# Patient Record
Sex: Female | Born: 1970 | Race: White | Hispanic: No | Marital: Single | Smoking: Former smoker
Health system: Southern US, Community
[De-identification: ages and names within clinical notes are randomized; demographics above are authoritative.]

## PROBLEM LIST (undated history)

## (undated) DIAGNOSIS — R002 Palpitations: Secondary | ICD-10-CM

## (undated) DIAGNOSIS — IMO0002 Reserved for concepts with insufficient information to code with codable children: Secondary | ICD-10-CM

## (undated) DIAGNOSIS — D649 Anemia, unspecified: Secondary | ICD-10-CM

## (undated) DIAGNOSIS — D721 Eosinophilia, unspecified: Secondary | ICD-10-CM

## (undated) HISTORY — DX: Eosinophilia: D72.1

## (undated) HISTORY — DX: Reserved for concepts with insufficient information to code with codable children: IMO0002

## (undated) HISTORY — DX: Anemia, unspecified: D64.9

## (undated) HISTORY — DX: Palpitations: R00.2

## (undated) HISTORY — DX: Eosinophilia, unspecified: D72.10

## (undated) HISTORY — PX: OTHER SURGICAL HISTORY: SHX169

---

## 1992-01-21 HISTORY — PX: WISDOM TOOTH EXTRACTION: SHX21

## 1997-01-20 HISTORY — PX: FLEXIBLE SIGMOIDOSCOPY: SHX1649

## 1997-05-03 ENCOUNTER — Other Ambulatory Visit: Admission: RE | Admit: 1997-05-03 | Discharge: 1997-05-03 | Payer: Self-pay | Admitting: Gynecology

## 1997-06-16 ENCOUNTER — Ambulatory Visit (HOSPITAL_COMMUNITY): Admission: RE | Admit: 1997-06-16 | Discharge: 1997-06-16 | Payer: Self-pay | Admitting: Obstetrics and Gynecology

## 1997-10-04 ENCOUNTER — Encounter: Admission: RE | Admit: 1997-10-04 | Discharge: 1998-01-02 | Payer: Self-pay | Admitting: Gynecology

## 1997-12-15 ENCOUNTER — Inpatient Hospital Stay (HOSPITAL_COMMUNITY): Admission: AD | Admit: 1997-12-15 | Discharge: 1997-12-17 | Payer: Self-pay | Admitting: Obstetrics and Gynecology

## 1997-12-25 ENCOUNTER — Encounter (HOSPITAL_COMMUNITY): Admission: RE | Admit: 1997-12-25 | Discharge: 1998-03-25 | Payer: Self-pay | Admitting: Obstetrics and Gynecology

## 1998-01-26 ENCOUNTER — Other Ambulatory Visit: Admission: RE | Admit: 1998-01-26 | Discharge: 1998-01-26 | Payer: Self-pay | Admitting: Obstetrics and Gynecology

## 1999-04-02 ENCOUNTER — Other Ambulatory Visit: Admission: RE | Admit: 1999-04-02 | Discharge: 1999-04-02 | Payer: Self-pay | Admitting: Obstetrics and Gynecology

## 2000-01-21 DIAGNOSIS — R002 Palpitations: Secondary | ICD-10-CM

## 2000-01-21 HISTORY — DX: Palpitations: R00.2

## 2000-04-03 ENCOUNTER — Other Ambulatory Visit: Admission: RE | Admit: 2000-04-03 | Discharge: 2000-04-03 | Payer: Self-pay | Admitting: Obstetrics and Gynecology

## 2001-04-06 ENCOUNTER — Other Ambulatory Visit: Admission: RE | Admit: 2001-04-06 | Discharge: 2001-04-06 | Payer: Self-pay | Admitting: Obstetrics and Gynecology

## 2001-10-07 ENCOUNTER — Ambulatory Visit (HOSPITAL_COMMUNITY): Admission: RE | Admit: 2001-10-07 | Discharge: 2001-10-07 | Payer: Self-pay | Admitting: Obstetrics and Gynecology

## 2001-10-07 ENCOUNTER — Encounter (INDEPENDENT_AMBULATORY_CARE_PROVIDER_SITE_OTHER): Payer: Self-pay | Admitting: Specialist

## 2002-02-03 ENCOUNTER — Other Ambulatory Visit: Admission: RE | Admit: 2002-02-03 | Discharge: 2002-02-03 | Payer: Self-pay | Admitting: Obstetrics and Gynecology

## 2002-03-17 ENCOUNTER — Encounter: Admission: RE | Admit: 2002-03-17 | Discharge: 2002-06-15 | Payer: Self-pay | Admitting: Obstetrics and Gynecology

## 2002-09-05 ENCOUNTER — Inpatient Hospital Stay (HOSPITAL_COMMUNITY): Admission: AD | Admit: 2002-09-05 | Discharge: 2002-09-07 | Payer: Self-pay | Admitting: Obstetrics and Gynecology

## 2002-10-17 ENCOUNTER — Other Ambulatory Visit: Admission: RE | Admit: 2002-10-17 | Discharge: 2002-10-17 | Payer: Self-pay | Admitting: Obstetrics and Gynecology

## 2003-10-23 ENCOUNTER — Other Ambulatory Visit: Admission: RE | Admit: 2003-10-23 | Discharge: 2003-10-23 | Payer: Self-pay | Admitting: Obstetrics and Gynecology

## 2004-06-19 ENCOUNTER — Ambulatory Visit: Payer: Self-pay | Admitting: Internal Medicine

## 2004-07-25 ENCOUNTER — Ambulatory Visit: Payer: Self-pay | Admitting: Internal Medicine

## 2004-08-01 ENCOUNTER — Ambulatory Visit: Payer: Self-pay | Admitting: Internal Medicine

## 2004-11-05 ENCOUNTER — Other Ambulatory Visit: Admission: RE | Admit: 2004-11-05 | Discharge: 2004-11-05 | Payer: Self-pay | Admitting: Obstetrics and Gynecology

## 2005-11-17 ENCOUNTER — Other Ambulatory Visit: Admission: RE | Admit: 2005-11-17 | Discharge: 2005-11-17 | Payer: Self-pay | Admitting: Obstetrics and Gynecology

## 2006-02-17 ENCOUNTER — Ambulatory Visit: Payer: Self-pay | Admitting: Internal Medicine

## 2006-04-29 ENCOUNTER — Ambulatory Visit: Payer: Self-pay | Admitting: Internal Medicine

## 2006-04-29 LAB — CONVERTED CEMR LAB
Basophils Relative: 1.5 % — ABNORMAL HIGH (ref 0.0–1.0)
Eosinophils Relative: 3.9 % (ref 0.0–5.0)
Free T4: 0.7 ng/dL (ref 0.6–1.6)
HCT: 37.4 % (ref 36.0–46.0)
Hemoglobin: 12.7 g/dL (ref 12.0–15.0)
Magnesium: 2.1 mg/dL (ref 1.5–2.5)
Monocytes Absolute: 0.3 10*3/uL (ref 0.2–0.7)
Neutrophils Relative %: 68.4 % (ref 43.0–77.0)
RBC: 4.21 M/uL (ref 3.87–5.11)
RDW: 13.9 % (ref 11.5–14.6)
T3, Free: 2.5 pg/mL (ref 2.3–4.2)
TSH: 1.91 microintl units/mL (ref 0.35–5.50)
WBC: 6.5 10*3/uL (ref 4.5–10.5)

## 2006-05-15 ENCOUNTER — Ambulatory Visit: Payer: Self-pay

## 2006-11-23 ENCOUNTER — Other Ambulatory Visit: Admission: RE | Admit: 2006-11-23 | Discharge: 2006-11-23 | Payer: Self-pay | Admitting: Obstetrics and Gynecology

## 2007-01-20 ENCOUNTER — Telehealth: Payer: Self-pay | Admitting: Internal Medicine

## 2007-03-09 ENCOUNTER — Telehealth (INDEPENDENT_AMBULATORY_CARE_PROVIDER_SITE_OTHER): Payer: Self-pay | Admitting: *Deleted

## 2007-03-15 ENCOUNTER — Ambulatory Visit: Payer: Self-pay | Admitting: Internal Medicine

## 2007-03-15 DIAGNOSIS — J4520 Mild intermittent asthma, uncomplicated: Secondary | ICD-10-CM | POA: Insufficient documentation

## 2008-05-08 ENCOUNTER — Telehealth (INDEPENDENT_AMBULATORY_CARE_PROVIDER_SITE_OTHER): Payer: Self-pay | Admitting: *Deleted

## 2008-05-10 ENCOUNTER — Telehealth (INDEPENDENT_AMBULATORY_CARE_PROVIDER_SITE_OTHER): Payer: Self-pay | Admitting: *Deleted

## 2008-05-23 ENCOUNTER — Ambulatory Visit: Payer: Self-pay | Admitting: Internal Medicine

## 2008-05-23 DIAGNOSIS — D649 Anemia, unspecified: Secondary | ICD-10-CM | POA: Insufficient documentation

## 2008-05-23 LAB — CONVERTED CEMR LAB
Basophils Absolute: 0 10*3/uL (ref 0.0–0.1)
Direct LDL: 70.3 mg/dL
HCT: 36.7 % (ref 36.0–46.0)
Hemoglobin: 12.4 g/dL (ref 12.0–15.0)
Lymphs Abs: 1.7 10*3/uL (ref 0.7–4.0)
MCV: 85.5 fL (ref 78.0–100.0)
Monocytes Absolute: 0.4 10*3/uL (ref 0.1–1.0)
Monocytes Relative: 7.3 % (ref 3.0–12.0)
Neutro Abs: 2.1 10*3/uL (ref 1.4–7.7)
RDW: 15.1 % — ABNORMAL HIGH (ref 11.5–14.6)
Saturation Ratios: 15 % — ABNORMAL LOW (ref 20.0–50.0)
Total CHOL/HDL Ratio: 2
VLDL: 30.8 mg/dL (ref 0.0–40.0)
Vitamin B-12: 291 pg/mL (ref 211–911)

## 2008-05-24 ENCOUNTER — Encounter (INDEPENDENT_AMBULATORY_CARE_PROVIDER_SITE_OTHER): Payer: Self-pay | Admitting: *Deleted

## 2008-06-02 ENCOUNTER — Ambulatory Visit: Payer: Self-pay | Admitting: Internal Medicine

## 2008-06-02 LAB — CONVERTED CEMR LAB
OCCULT 1: NEGATIVE
OCCULT 2: NEGATIVE
OCCULT 3: NEGATIVE

## 2008-06-05 ENCOUNTER — Encounter (INDEPENDENT_AMBULATORY_CARE_PROVIDER_SITE_OTHER): Payer: Self-pay | Admitting: *Deleted

## 2008-08-25 ENCOUNTER — Encounter: Payer: Self-pay | Admitting: Internal Medicine

## 2008-09-13 ENCOUNTER — Encounter (INDEPENDENT_AMBULATORY_CARE_PROVIDER_SITE_OTHER): Payer: Self-pay | Admitting: *Deleted

## 2008-09-18 ENCOUNTER — Ambulatory Visit: Payer: Self-pay | Admitting: Internal Medicine

## 2008-09-20 ENCOUNTER — Ambulatory Visit: Payer: Self-pay | Admitting: Internal Medicine

## 2008-11-16 ENCOUNTER — Emergency Department (HOSPITAL_COMMUNITY): Admission: EM | Admit: 2008-11-16 | Discharge: 2008-11-16 | Payer: Self-pay | Admitting: Emergency Medicine

## 2009-07-30 ENCOUNTER — Telehealth (INDEPENDENT_AMBULATORY_CARE_PROVIDER_SITE_OTHER): Payer: Self-pay | Admitting: *Deleted

## 2009-12-17 ENCOUNTER — Telehealth (INDEPENDENT_AMBULATORY_CARE_PROVIDER_SITE_OTHER): Payer: Self-pay | Admitting: *Deleted

## 2010-02-19 NOTE — Progress Notes (Signed)
Summary: Refill Request  Phone Note Refill Request Call back at [phone] Message from:  Pharmacy on July 30, 2009 8:26 AM  Refills Requested: Medication #1:  SYMBICORT 80-4.5 MCG/ACT AERO take 1-2 inhalation every 12 hours   Dosage confirmed as above?Dosage Confirmed   Supply Requested: 1 month   Last Refilled: 04/28/2009 Karin Golden  [phone] [place] [address]  Next Appointment Scheduled: none Initial call taken by: Lavell Islam,  July 30, 2009 8:27 AM    Prescriptions: SYMBICORT 80-4.5 MCG/ACT AERO (BUDESONIDE-FORMOTEROL FUMARATE) take 1-2 inhalation every 12 hours  #1 x 0   Entered by:   Shonna Chock   Authorized by:   Marga Melnick MD   Signed by:   Shonna Chock on 07/30/2009   Method used:   Electronically to        [facility] (retail)       [address]       [place], [place]  [address]       Ph: [phone]       Fax: [phone]   RxID:   [id number]  Due for yearly (CPX-30 min appointment)./ Shonna Chock  July 30, 2009 8:43 AM

## 2010-02-19 NOTE — Progress Notes (Signed)
Summary: Refill Request  Phone Note Refill Request Call back at [phone] Message from:  Pharmacy on December 17, 2009 10:13 AM  Refills Requested: Medication #1:  SYMBICORT 80-4.5 MCG/ACT AERO take 1-2 inhalation every 12 hours   Dosage confirmed as above?Dosage Confirmed   Supply Requested: 10.2   Last Refilled: 07/30/2009 Karin Golden on [address]  Next Appointment Scheduled: none Initial call taken by: Harold Barban,  December 17, 2009 10:13 AM    Prescriptions: SYMBICORT 80-4.5 MCG/ACT AERO (BUDESONIDE-FORMOTEROL FUMARATE) take 1-2 inhalation every 12 hours  #1 x 0   Entered by:   Shonna Chock CMA   Authorized by:   Marga Melnick MD   Signed by:   Shonna Chock CMA on 12/17/2009   Method used:   Electronically to        [facility] (retail)       [address]       [place], [place]  [address]       Ph: [phone]       Fax: [phone]   RxID:   [id number]

## 2010-03-26 ENCOUNTER — Ambulatory Visit: Payer: Self-pay | Admitting: Internal Medicine

## 2010-05-17 ENCOUNTER — Encounter: Payer: Self-pay | Admitting: Internal Medicine

## 2010-06-07 NOTE — Assessment & Plan Note (Signed)
[facility]                        [place] OFFICE NOTE   Jennifer Stephens                        MRN:          [id number]  DATE:04/29/2006                            DOB:          Feb 21, 1970    Jennifer Stephens was seen April 29, 2006 complaining of palpitations and  irregular heartbeat. She had one episode in July while playing softball  and the second while playing golf in October 2007. The most recent  occurred the day of office visit while playing tennis. She believes she  may have had milder episodes since 2006 which lasted less than 1 minute.  She describes her heart as racing double time; this morning it lasted  15 minutes.   She does describe increased stress; she has 2 young  children.   She denies headache, flushing, chest pain or diarrhea.   She has been told she has borderline anemia.   PAST MEDICAL HISTORY:  Oral surgery, 2 pregnancies and deliveries. The  palpitations were evaluated in 2002 with a Holter which revealed sinus  arrhythmia. She had a flexible sigmoidoscopy for rectal bleeding in 1999  which was negative. She does have a history of asthma.   FAMILY HISTORY:  Prostate cancer, heart attack, emphysema, stroke, four-  vessel bypass in her father.   She quit smoking in 1998. She will have 4-5 beers a night and questions  the role of the alcohol in relation to the palpitations. She also drinks  3 cups of coffee a day.   She denies ingestion of stimulants. She has no known drug allergies. She  is on Asmanex and Lexapro as well as p.r.n. fexofenadine.   VITAL SIGNS:  Weight is stable at 147.6, pulse was 58 and regular,  respiratory rate 12 and blood pressure 104/62.  NECK:  The thyroid was full but doughy.  HEART:  She had an S4 with no murmurs or gallops. The aorta is palpable.  There is no aortic aneurysm.  EXTREMITIES:  All pulses are intact, there is no edema. Skin is warm and  dry. Deep tendon reflexes are  normal.  NEUROPSYCHIATRIC:  Normal.   Normal negative studies include CBC and differential, potassium,  calcium, magnesium,full thyroid profile & EKG .   Because these episodes have been associated with exercise, a Bruce  stress test will be scheduled. If that is negative and symptoms persist,  a cardiology consultation or an event monitor would be pursued.     Titus Dubin. Alwyn Ren, MD,FACP,FCCP  Electronically Signed    [facility]/MedQ  DD: 05/01/2006  DT: 05/01/2006  Job #: [id number]

## 2010-06-07 NOTE — H&P (Signed)
   NAMEKADEN, DUNKEL                             ACCOUNT NO.:  [id number]   MEDICAL RECORD NO.:  [id number]                   PATIENT TYPE:  AMB   LOCATION:  SDC                                  [facility]ILITY:  [facility]   PHYSICIAN:  James A. Ashley Royalty, M.D.             DATE OF BIRTH:  [birth year]   DATE OF ADMISSION:  10/07/2001  DATE OF DISCHARGE:                                HISTORY & PHYSICAL   HISTORY OF PRESENT ILLNESS:  The patient is a 40 year old gravida 2, para 1  at approximately 10 weeks one day gestation by last menstrual period.  She  presented today for new OB appointment.  Fetal heart tones could not be  auscultated with the Doppler.  She was subsequently taken to ultrasound at  which time a missed abortion was diagnosed.  The fetus was noted to be  approximately 6 weeks 6 days size with no cardiac activity.  The uterus was  noted to be retroverted.   PAST MEDICAL HISTORY:  Asthma.   PAST SURGICAL HISTORY:  None.   ALLERGIES:  None.   SOCIAL HISTORY:  The patient denies use of tobacco or significant alcohol.   MEDICATIONS:  Albuterol, Serevent, Flovent.   PHYSICAL EXAMINATION:  GENERAL:  Well-developed, well-nourished, pleasant  white female in no acute distress.  VITAL SIGNS:  Vital signs stable.  SKIN:  Warm and dry without lesions.  NODES:  There is no supraclavicular, cervical, or inguinal adenopathy.  HEENT:  Normocephalic.  NECK:  Supple without thyromegaly.  CHEST:  Lungs are clear.  CARDIAC:  Regular rate and rhythm without murmur, rub, or gallop.  BREASTS:  No palpable mass, discharge, drainage, or adenopathy.  ABDOMEN:  Soft, nontender without masses or organomegaly.  Bowel sounds are  active.  MUSCULOSKELETAL:  Full range of motion without edema, cyanosis, or CVA  tenderness.  PELVIC:  External genitalia within normal limits.  Vagina and cervix are  without gross lesions.  Bimanual examination reveals the uterus to be  approximately 8 x 4 x 4 cm.  No  adnexal masses were palpable.   IMPRESSION:  Missed abortion at approximately [redacted] weeks gestation.   PLAN:  Suction dilatation and curettage.  Risks, benefits, complications,  and alternatives fully discussed with the patient.  She states she  understands and accepts.  Questions invited and answered.                                               James A. Ashley Royalty, M.D.   JAM/MEDQ  D:  10/07/2001  T:  10/07/2001  Job:  [id number]

## 2010-06-07 NOTE — Op Note (Signed)
   NAMEJOLYNNE, Jennifer Stephens                             ACCOUNT NO.:  [id number]   MEDICAL RECORD NO.:  [id number]                   PATIENT TYPE:  AMB   LOCATION:  SDC                                  [facility]   PHYSICIAN:  James A. Ashley Royalty, M.D.             DATE OF BIRTH:  1970/06/04   DATE OF PROCEDURE:  10/07/2001  DATE OF DISCHARGE:  10/07/2001                                 OPERATIVE REPORT   PREOPERATIVE DIAGNOSES:  Missed abortion at [redacted] weeks gestation.   POSTOPERATIVE DIAGNOSES:  Missed abortion at [redacted] weeks gestation.   PROCEDURE:  Suction dilatation and curettage.   SURGEON:  Rudy Jew. Ashley Royalty, M.D.   ANESTHESIA:  Monitored anesthesia care with 1% Xylocaine paracervical block  (20 cc).   FINDINGS:  The uterus was sounded to approximately 10 cc.  A moderate amount  of apparent products of conception was recovered.   ESTIMATED BLOOD LOSS:  75 cc.   COMPLICATIONS:  None.   PACKS AND DRAINS:  None.   PROCEDURE:  The patient was taken to the operating room and placed in the  dorsal supine position.  After adequate IV sedation was administered, she  was placed in the lithotomy position and was prepped and draped in the usual  manner for vaginal surgery.  A posterior weighted retractor was placed per  vagina.  The anterior lip of the cervix was grasped with a single-tooth  tenaculum.  Next, 1% Xylocaine was instilled circumferentially around the  cervix with a quantity of 20 cc total.  The uterus was gently sounded to  approximately 10 cm.  The cervix was then serially dilated to a size 31  [place] using [employer] dilators.  A 10 mm suction curette was then introduced  into the uterine cavity.  Suction was applied.  A moderate amount of  apparent products of conception was delivered through the tubing.  After  several passes with the suction curette, no additional tissue was obtained.  At this point, the vaginal instruments were removed.  Hemostasis was noted  and the  procedure terminated.   The patient tolerated the procedure extremely well and was returned to the  recovery room in good condition.  Type and Rh pending.  If Rh-, she will be  given RhoGAM.                                               Rudy Jew. Ashley Royalty, M.D.    JAM/MEDQ  D:  10/07/2001  T:  10/09/2001  Job:  [id number]

## 2010-06-07 NOTE — Assessment & Plan Note (Signed)
[facility]                                 ON-CALL NOTE   RHEDA, KASSAB                          MRN:          [id number]  DATE:09/12/2006                            DOB:          [birth year]    PHONE NUMBER:  [phone]   PRIMARY CARE PHYSICIAN:  Marga Melnick, MD   TIME OF CALL:  10:25 p.m. on August 23   She calls me because she in [place] and just ran out of her  Symbicort and wants a refill.   PLAN:  I explained that at 10:30 at night on a Saturday is not the  appropriate time to be calling for refills and that we try not to refill  medicines on the weekend. She promptly started crying and telling me she  was not abusing the system and she is out of town, Catering manager, etc.. I thought  I was being very realistic and just trying to educate her about being  aware of her prescriptions and she said it was totally not her fault,  how could she know that she was running out of her inhaler and I  suggested that I certainly would not know when she was running out of  her inhaler and asked her to keep an eye on her dates. Anyway, I did try  to educate her about that and I do not think it went too well and I did  refill her Symbicort once with no refills to pharmacy in [place]  at [phone].     Karie Schwalbe, MD  Electronically Signed    RIL/MedQ  DD: 09/12/2006  DT: 09/13/2006  Job #: [id number]   cc:   Titus Dubin. Alwyn Ren, MD,FACP,FCCP

## 2010-07-13 ENCOUNTER — Encounter: Payer: Self-pay | Admitting: Family Medicine

## 2010-07-18 ENCOUNTER — Ambulatory Visit (INDEPENDENT_AMBULATORY_CARE_PROVIDER_SITE_OTHER): Payer: BC Managed Care – PPO | Admitting: Internal Medicine

## 2010-07-18 ENCOUNTER — Encounter: Payer: Self-pay | Admitting: Internal Medicine

## 2010-07-18 VITALS — BP 120/78 | HR 56 | Temp 97.9°F | Resp 16 | Ht 67.5 in | Wt 154.0 lb

## 2010-07-18 DIAGNOSIS — Z789 Other specified health status: Secondary | ICD-10-CM

## 2010-07-18 DIAGNOSIS — D649 Anemia, unspecified: Secondary | ICD-10-CM

## 2010-07-18 DIAGNOSIS — J45909 Unspecified asthma, uncomplicated: Secondary | ICD-10-CM

## 2010-07-18 DIAGNOSIS — Z52008 Unspecified donor, other blood: Secondary | ICD-10-CM | POA: Insufficient documentation

## 2010-07-18 DIAGNOSIS — Z136 Encounter for screening for cardiovascular disorders: Secondary | ICD-10-CM

## 2010-07-18 DIAGNOSIS — Z0000 Encounter for general adult medical examination without abnormal findings: Secondary | ICD-10-CM

## 2010-07-18 LAB — CBC WITH DIFFERENTIAL/PLATELET
Basophils Relative: 1.8 % (ref 0.0–3.0)
Lymphocytes Relative: 29.1 % (ref 12.0–46.0)
Lymphs Abs: 1.5 10*3/uL (ref 0.7–4.0)
MCHC: 33.7 g/dL (ref 30.0–36.0)
Monocytes Absolute: 0.4 10*3/uL (ref 0.1–1.0)
Neutro Abs: 2.5 10*3/uL (ref 1.4–7.7)
Neutrophils Relative %: 48.2 % (ref 43.0–77.0)
RDW: 17 % — ABNORMAL HIGH (ref 11.5–14.6)

## 2010-07-18 LAB — HEPATIC FUNCTION PANEL
Alkaline Phosphatase: 33 U/L — ABNORMAL LOW (ref 39–117)
Bilirubin, Direct: 0.1 mg/dL (ref 0.0–0.3)
Total Protein: 7.1 g/dL (ref 6.0–8.3)

## 2010-07-18 LAB — IBC PANEL
Iron: 80 ug/dL (ref 42–145)
Saturation Ratios: 14.6 % — ABNORMAL LOW (ref 20.0–50.0)
Transferrin: 390.7 mg/dL — ABNORMAL HIGH (ref 212.0–360.0)

## 2010-07-18 LAB — BASIC METABOLIC PANEL
BUN: 10 mg/dL (ref 6–23)
CO2: 25 mEq/L (ref 19–32)
Calcium: 9.3 mg/dL (ref 8.4–10.5)
Creatinine, Ser: 0.7 mg/dL (ref 0.4–1.2)
Glucose, Bld: 78 mg/dL (ref 70–99)

## 2010-07-18 LAB — TSH: TSH: 1.62 u[IU]/mL (ref 0.35–5.50)

## 2010-07-18 LAB — LIPID PANEL
Cholesterol: 224 mg/dL — ABNORMAL HIGH (ref 0–200)
Total CHOL/HDL Ratio: 2
VLDL: 22.2 mg/dL (ref 0.0–40.0)

## 2010-07-18 MED ORDER — ALBUTEROL SULFATE HFA 108 (90 BASE) MCG/ACT IN AERS: 2.0000 | INHALATION_SPRAY | RESPIRATORY_TRACT | Status: DC | PRN

## 2010-07-18 MED ORDER — BUDESONIDE-FORMOTEROL FUMARATE 80-4.5 MCG/ACT IN AERO: 2.0000 | INHALATION_SPRAY | Freq: Two times a day (BID) | RESPIRATORY_TRACT | Status: DC

## 2010-07-18 NOTE — Progress Notes (Signed)
Subjective:    Patient ID: Jennifer Stephens, female    DOB: Oct 04, 1970, 40 y.o.   MRN: [id number]  HPI  Jennifer Stephens  is here for a physical; she has no acute issues.     Review of Systems Patient reports no vision/ hearing  changes, adenopathy,fever, weight change,  persistant / recurrent hoarseness , swallowing issues, chest pain,edema,persistant /recurrent cough, hemoptysis, dyspnea( rest/ exertional/paroxysmal nocturnal), gastrointestinal bleeding (melena, rectal bleeding), abdominal pain, significant heartburn,  bowel changes,GU symptoms(dysuria, hematuria,pyuria, incontinence ), Gyn symptoms(abnormal  bleeding , pain),  syncope, focal weakness, memory loss,numbness & tingling, skin /nail changes,abnormal  bleeding, or depression.  She has occasional induced exercise induced palpitations as skipping ( Note : Stress Test was negative 2008 when more frequent). Easy bruising. Some anxiety with marital stress; presently separated. Some hair loss , ? related to her anemia.     Objective:   Physical Exam Gen.: Healthy and well-nourished in appearance. Alert, appropriate and cooperative throughout exam. Head: Normocephalic without obvious abnormalities Eyes: No corneal or conjunctival inflammation noted. Pupils equal round reactive to light and accommodation. Fundal exam is benign without hemorrhages, exudate, papilledema. Extraocular motion intact. Vision grossly normal. Ears: External  ear exam reveals no significant lesions or deformities. Canals clear .TMs normal. Hearing is grossly normal bilaterally. Nose: External nasal exam reveals no deformity or inflammation. Nasal mucosa are pink and moist. No lesions or exudates noted. Septum minimally dislocated Mouth: Oral mucosa and oropharynx reveal no lesions or exudates. Teeth in good repair. Neck: No deformities, masses, or tenderness noted. Range of motion &. Thyroid normal. Lungs: Normal respiratory effort; chest expands symmetrically. Lungs are  clear to auscultation without rales, wheezes, or increased work of breathing. Heart: Normal rate and rhythm. Normal S1 and S2. No gallop, click, or rub. S4 w/o  murmur. Abdomen: Bowel sounds normal; abdomen soft and nontender. No masses, organomegaly or hernias noted. Genitalia: Dr Tenny Craw.                                                                                      Musculoskeletal/extremities: No deformity or scoliosis noted of  the thoracic or lumbar spine. No clubbing, cyanosis, edema, or deformity noted. Range of motion  normal .Tone & strength  normal.Joints normal. Nail health  good. Vascular: Carotid, radial artery, dorsalis pedis and posterior tibial pulses are full and equal. No bruits present. Neurologic: Alert and oriented x3. Deep tendon reflexes symmetrical and normal.          Skin: Intact without suspicious lesions or rashes. Lymph: No cervical, axillary  lymphadenopathy present. Psych: Mood and affect are normal. Normally interactive                                                                                         Assessment & Plan:  #1  comprehensive physical exam; no acute findings #2 see Problem List with Assessments & Recommendations Plan: see Orders

## 2010-07-18 NOTE — Patient Instructions (Signed)
Preventive Health Care: Exercise  30-45  minutes a day, 3-4 days a week. Walking is especially valuable in preventing Osteoporosis. Eat a low-fat diet with lots of fruits and vegetables, up to 7-9 servings per day. Seatbelts can save your life. Wear them always. Depression is common in our stressful world. If you're feeling down or losing interest in things you normally enjoy, please call. Marland Kitchen

## 2010-07-19 ENCOUNTER — Encounter: Payer: Self-pay | Admitting: *Deleted

## 2010-07-19 ENCOUNTER — Telehealth: Payer: Self-pay | Admitting: *Deleted

## 2010-07-19 MED ORDER — LORAZEPAM 0.5 MG PO TABS: 0.5000 mg | ORAL_TABLET | Freq: Two times a day (BID) | ORAL | Status: AC | PRN

## 2010-07-19 NOTE — Telephone Encounter (Signed)
Pt aware Rx sent to pharmacy 

## 2010-07-19 NOTE — Telephone Encounter (Signed)
Message copied by Verdene Rio on Fri Jul 19, 2010  2:27 PM ------      Message from: Pecola Lawless      Created: Fri Jul 19, 2010  1:02 PM       Please send Rx for Lorazepam 0.5 mg every 12 hrs prn for stress. Do not take with alcohol. # 30 with labs

## 2010-12-06 ENCOUNTER — Other Ambulatory Visit: Payer: Self-pay | Admitting: Obstetrics and Gynecology

## 2011-05-27 ENCOUNTER — Other Ambulatory Visit: Payer: Self-pay | Admitting: Internal Medicine

## 2011-05-27 DIAGNOSIS — J45909 Unspecified asthma, uncomplicated: Secondary | ICD-10-CM

## 2011-05-27 MED ORDER — BUDESONIDE-FORMOTEROL FUMARATE 80-4.5 MCG/ACT IN AERO: 2.0000 | INHALATION_SPRAY | Freq: Two times a day (BID) | RESPIRATORY_TRACT | Status: DC

## 2011-05-27 NOTE — Telephone Encounter (Signed)
RX sent

## 2011-05-27 NOTE — Telephone Encounter (Signed)
refill symbicort 80-4. AER qty 10.2 Use one to two inhalations every twelve hours Last filled 1.2.13 Last OV 6.8.12, no future appointment listed

## 2011-06-10 ENCOUNTER — Telehealth: Payer: Self-pay | Admitting: Internal Medicine

## 2011-06-10 NOTE — Telephone Encounter (Signed)
Refill: Lorazepam 0.5mg  tab. Take 1 tablet by mouth every 12 hours as needed for anxiety. Qty 30. Last fill 07-19-10

## 2011-06-10 NOTE — Telephone Encounter (Signed)
Last OV 07/18/10, please advise on refill request. Not on medication list, was previously rx'ed

## 2011-06-11 MED ORDER — LORAZEPAM 0.5 MG PO TABS: 0.5000 mg | ORAL_TABLET | Freq: Two times a day (BID) | ORAL | Status: AC | PRN

## 2011-06-11 NOTE — Telephone Encounter (Signed)
Okay x1; would need office visit prior to refill. Last seen 6/12

## 2011-06-11 NOTE — Telephone Encounter (Signed)
RX sent

## 2011-09-16 ENCOUNTER — Telehealth: Payer: Self-pay | Admitting: Internal Medicine

## 2011-09-16 DIAGNOSIS — J45909 Unspecified asthma, uncomplicated: Secondary | ICD-10-CM

## 2011-09-16 MED ORDER — ALBUTEROL SULFATE HFA 108 (90 BASE) MCG/ACT IN AERS: 2.0000 | INHALATION_SPRAY | RESPIRATORY_TRACT | Status: DC | PRN

## 2011-09-16 NOTE — Telephone Encounter (Signed)
Patient will need to schedule a CPX  

## 2011-09-16 NOTE — Telephone Encounter (Signed)
Refill: Ventolin hfa 90 mcg/inh aer. Inhale one to two puffs every four hours as needed. Qty 18. Last fill 12-28-08

## 2011-10-15 ENCOUNTER — Other Ambulatory Visit: Payer: Self-pay | Admitting: Internal Medicine

## 2011-10-16 ENCOUNTER — Other Ambulatory Visit: Payer: Self-pay

## 2011-10-16 DIAGNOSIS — J45909 Unspecified asthma, uncomplicated: Secondary | ICD-10-CM

## 2011-10-16 MED ORDER — BUDESONIDE-FORMOTEROL FUMARATE 80-4.5 MCG/ACT IN AERO: 2.0000 | INHALATION_SPRAY | Freq: Two times a day (BID) | RESPIRATORY_TRACT | Status: DC

## 2011-10-16 NOTE — Telephone Encounter (Signed)
Rx sent. Pt next appt scheduled 12/26/11 was over due for appt.

## 2011-12-21 DIAGNOSIS — R87619 Unspecified abnormal cytological findings in specimens from cervix uteri: Secondary | ICD-10-CM

## 2011-12-21 DIAGNOSIS — IMO0002 Reserved for concepts with insufficient information to code with codable children: Secondary | ICD-10-CM

## 2011-12-21 HISTORY — DX: Unspecified abnormal cytological findings in specimens from cervix uteri: R87.619

## 2011-12-21 HISTORY — DX: Reserved for concepts with insufficient information to code with codable children: IMO0002

## 2011-12-21 HISTORY — PX: CERVIX SURGERY: SHX593

## 2011-12-26 ENCOUNTER — Encounter: Payer: BC Managed Care – PPO | Admitting: Internal Medicine

## 2012-01-02 DIAGNOSIS — R87610 Atypical squamous cells of undetermined significance on cytologic smear of cervix (ASC-US): Secondary | ICD-10-CM | POA: Insufficient documentation

## 2012-02-18 ENCOUNTER — Encounter: Payer: BC Managed Care – PPO | Admitting: Internal Medicine

## 2012-02-20 ENCOUNTER — Ambulatory Visit (INDEPENDENT_AMBULATORY_CARE_PROVIDER_SITE_OTHER): Payer: BC Managed Care – PPO | Admitting: Internal Medicine

## 2012-02-20 ENCOUNTER — Encounter: Payer: Self-pay | Admitting: Internal Medicine

## 2012-02-20 VITALS — BP 120/78 | HR 79 | Temp 98.2°F | Resp 12 | Ht 67.03 in | Wt 159.0 lb

## 2012-02-20 DIAGNOSIS — J209 Acute bronchitis, unspecified: Secondary | ICD-10-CM

## 2012-02-20 DIAGNOSIS — Z0000 Encounter for general adult medical examination without abnormal findings: Secondary | ICD-10-CM

## 2012-02-20 DIAGNOSIS — Z23 Encounter for immunization: Secondary | ICD-10-CM

## 2012-02-20 DIAGNOSIS — J45909 Unspecified asthma, uncomplicated: Secondary | ICD-10-CM

## 2012-02-20 LAB — CBC WITH DIFFERENTIAL/PLATELET
Basophils Absolute: 0 10*3/uL (ref 0.0–0.1)
Eosinophils Absolute: 0.2 10*3/uL (ref 0.0–0.7)
Hemoglobin: 12.9 g/dL (ref 12.0–15.0)
Lymphocytes Relative: 18.3 % (ref 12.0–46.0)
MCHC: 33.5 g/dL (ref 30.0–36.0)
Monocytes Absolute: 0.4 10*3/uL (ref 0.1–1.0)
Neutro Abs: 4.4 10*3/uL (ref 1.4–7.7)
RDW: 15.3 % — ABNORMAL HIGH (ref 11.5–14.6)

## 2012-02-20 MED ORDER — BUDESONIDE-FORMOTEROL FUMARATE 80-4.5 MCG/ACT IN AERO: 2.0000 | INHALATION_SPRAY | Freq: Two times a day (BID) | RESPIRATORY_TRACT | Status: DC

## 2012-02-20 MED ORDER — AMOXICILLIN 500 MG PO CAPS: 500.0000 mg | ORAL_CAPSULE | Freq: Three times a day (TID) | ORAL | Status: DC

## 2012-02-20 NOTE — Progress Notes (Signed)
  Subjective:    Patient ID: Jennifer Stephens, female    DOB: 05/06/1970, 42 y.o.   MRN: [id number]  HPI She is here for a physical; she denies acute issues except stress related to going through a divorce.      Review of Systems   Despite the situational stress; she denies significant anxiety, depression, anhedonia, or panic attacks. She denies anorexia or insomnia. She has not taken lorazepam since summer of 2013.  She describes acute bronchitic symptoms with chest congestion yellow sputum. This has not caused an exacerbation of asthma. She denies symptoms of rhinosinusitis such as frontal headache, facial pain, nasal purulence, fever, chills, or sweats.           Objective:   Physical Exam Gen.: Healthy and well-nourished in appearance. Alert, appropriate and cooperative throughout exam. Appears younger than stated age  Head: Normocephalic without obvious abnormalities  Eyes: No corneal or conjunctival inflammation noted. Pupils equal round reactive to light and accommodation. Fundal exam is benign without hemorrhages, exudate, papilledema. Extraocular motion intact. Vision grossly normal. Ears: External  ear exam reveals no significant lesions or deformities. Canals clear .TMs normal. Hearing is grossly normal bilaterally. Nose: External nasal exam reveals no deformity or inflammation. Nasal mucosa are pink and moist. No lesions or exudates noted.   Mouth: Oral mucosa and oropharynx reveal no lesions or exudates. Teeth in good repair.Slightly hoarse Neck: No deformities, masses, or tenderness noted. Range of motion & Thyroid normal. Lungs: Normal respiratory effort; chest expands symmetrically. Lungs are clear to auscultation without rales, wheezes, or increased work of breathing. Heart: Normal rate and rhythm. Normal S1 and S2. No gallop, click, or rub.No murmur. Abdomen: Bowel sounds normal; abdomen soft and nontender. No masses, organomegaly or hernias noted. Genitalia: Dr Tenny Craw                                     Musculoskeletal/extremities: No deformity or scoliosis noted of  the thoracic or lumbar spine. No clubbing, cyanosis, edema, or significant extremity  deformity noted. Range of motion normal .Tone & strength  normal.Joints normal . Nail health good. Able to lie down & sit up w/o help. Negative SLR bilaterally Vascular: Carotid, radial artery, dorsalis pedis and  posterior tibial pulses are full and equal. No bruits present. Neurologic: Alert and oriented x3. Deep tendon reflexes symmetrical and normal.  Skin: Intact without suspicious lesions or rashes. Lymph: No cervical, axillary lymphadenopathy present. Psych: Mood and affect are normal. Normally interactive                                                                                         Assessment & Plan:  #1 comprehensive physical exam; no acute findings #1 acute bronchitis w/o bronchospasm Plan: See orders and recommendations

## 2012-02-20 NOTE — Addendum Note (Signed)
Addended by: Maurice Small on: 02/20/2012 03:12 PM   Modules accepted: Orders

## 2012-02-20 NOTE — Patient Instructions (Addendum)
Preventive Health Care: Exercise at least 30-45 minutes a day,  3-4 days a week.  Eat a low-fat diet with lots of fruits and vegetables, up to 7-9 servings per day.  Consume less than 40 grams of sugar per day from foods & drinks with High Fructose Corn Sugar as #2,3 or # 4 on label. Alcohol If you drink, do it moderately,less than 9 drinks per week, preferably less than 6 @ most. To prevent palpitations or premature beats, avoid stimulants such as decongestants, diet pills, nicotine, or caffeine (coffee, tea, cola, or chocolate) to excess.  If you activate My Chart; the results can be released to you as soon as they populate from the lab. If you choose not to use this program; the labs have to be reviewed, copied & mailed   causing a delay in getting the results to you.

## 2012-06-16 ENCOUNTER — Telehealth: Payer: Self-pay | Admitting: *Deleted

## 2012-06-16 MED ORDER — LORAZEPAM 0.5 MG PO TABS: 0.5000 mg | ORAL_TABLET | Freq: Two times a day (BID) | ORAL | Status: DC | PRN

## 2012-06-16 NOTE — Telephone Encounter (Signed)
OK X 1 ; additional refills would require OV

## 2012-06-16 NOTE — Telephone Encounter (Signed)
Last OV 06-11-11 #30, last filled 02-20-12

## 2012-06-16 NOTE — Telephone Encounter (Signed)
Rx sent 

## 2012-08-02 ENCOUNTER — Other Ambulatory Visit: Payer: Self-pay | Admitting: *Deleted

## 2012-08-02 MED ORDER — LORAZEPAM 0.5 MG PO TABS: 0.5000 mg | ORAL_TABLET | Freq: Two times a day (BID) | ORAL | Status: DC | PRN

## 2012-08-02 NOTE — Telephone Encounter (Signed)
Rx sent 

## 2012-10-13 ENCOUNTER — Other Ambulatory Visit: Payer: Self-pay | Admitting: Obstetrics and Gynecology

## 2012-10-29 ENCOUNTER — Encounter: Payer: Self-pay | Admitting: Cardiology

## 2012-11-21 ENCOUNTER — Encounter (HOSPITAL_COMMUNITY): Payer: Self-pay | Admitting: Emergency Medicine

## 2012-11-21 ENCOUNTER — Emergency Department (HOSPITAL_COMMUNITY)
Admission: EM | Admit: 2012-11-21 | Discharge: 2012-11-22 | Disposition: A | Discharge status: Home / Self Care | Payer: BC Managed Care – PPO | Attending: Emergency Medicine | Admitting: Emergency Medicine

## 2012-11-21 DIAGNOSIS — Z79899 Other long term (current) drug therapy: Secondary | ICD-10-CM | POA: Insufficient documentation

## 2012-11-21 DIAGNOSIS — Z87891 Personal history of nicotine dependence: Secondary | ICD-10-CM | POA: Insufficient documentation

## 2012-11-21 DIAGNOSIS — I498 Other specified cardiac arrhythmias: Secondary | ICD-10-CM | POA: Insufficient documentation

## 2012-11-21 DIAGNOSIS — IMO0002 Reserved for concepts with insufficient information to code with codable children: Secondary | ICD-10-CM | POA: Insufficient documentation

## 2012-11-21 DIAGNOSIS — I471 Supraventricular tachycardia: Secondary | ICD-10-CM

## 2012-11-21 DIAGNOSIS — J45909 Unspecified asthma, uncomplicated: Secondary | ICD-10-CM | POA: Insufficient documentation

## 2012-11-21 LAB — CBC WITH DIFFERENTIAL/PLATELET
Eosinophils Relative: 11 % — ABNORMAL HIGH (ref 0–5)
Hemoglobin: 12.4 g/dL (ref 12.0–15.0)
Lymphocytes Relative: 38 % (ref 12–46)
Lymphs Abs: 4.9 10*3/uL — ABNORMAL HIGH (ref 0.7–4.0)
MCV: 87 fL (ref 78.0–100.0)
Neutro Abs: 5.3 10*3/uL (ref 1.7–7.7)
Neutrophils Relative %: 41 % — ABNORMAL LOW (ref 43–77)
Platelets: 418 10*3/uL — ABNORMAL HIGH (ref 150–400)
RBC: 4.45 MIL/uL (ref 3.87–5.11)
RDW: 15.9 % — ABNORMAL HIGH (ref 11.5–15.5)
WBC: 12.9 10*3/uL — ABNORMAL HIGH (ref 4.0–10.5)

## 2012-11-21 LAB — POCT I-STAT, CHEM 8
BUN: 19 mg/dL (ref 6–23)
Chloride: 107 mEq/L (ref 96–112)
Potassium: 4.8 mEq/L (ref 3.5–5.1)
Sodium: 140 mEq/L (ref 135–145)

## 2012-11-21 LAB — POCT I-STAT TROPONIN I: Troponin i, poc: 0 ng/mL (ref 0.00–0.08)

## 2012-11-21 MED ORDER — ADENOSINE 6 MG/2ML IV SOLN
INTRAVENOUS | Status: AC
  Filled 2012-11-21: qty 2

## 2012-11-21 MED ORDER — ADENOSINE 6 MG/2ML IV SOLN
6.0000 mg | Freq: Once | INTRAVENOUS | Status: AC
  Administered 2012-11-21: 6 mg via INTRAVENOUS

## 2012-11-21 NOTE — ED Provider Notes (Signed)
CSN: [id number]     Arrival date & time 11/21/12  2252 History   First MD Initiated Contact with Patient 11/21/12 2301     Chief Complaint  Patient presents with  . Tachycardia   (Consider location/radiation/quality/duration/timing/severity/associated sxs/prior Treatment) HPI 42 year old female with a long-standing history of a tachyarrhythmia. She's never had a formal diagnosis as the symptoms of never lasted more than a few minutes and usually resolve by her leaning down and placing her head between her legs. She had the sudden onset of rapid heart rate about 30 to 45 minutes prior to arrival. It did not respond to her usual maneuver. There is no associated chest pain but she does have dyspnea on exertion. She is not diaphoretic or nauseated. She has had some alcohol and caffeine today but not to excess. Arrival her heart rate is in the 160s.  Past Medical History  Diagnosis Date  . Asthma   . Palpitations 2002    sinus arrythmia on holter  . Eosinophilia   . Anemia     in context blood donor & vegetarian status  . Abnormal Pap smear 12/2011    Dr Tenny Craw   Past Surgical History  Procedure Laterality Date  . Wisdom tooth extraction  1994  . Flexible sigmoidoscopy  1999    Negative, Dr Virginia Rochester( done for rectal bleeding)  . Gravida 2 para 2      Dr Tenny Craw   Family History  Problem Relation Age of Onset  . Prostate cancer Father   . Heart attack Father 54  . COPD Father   . Emphysema Father   . Coronary artery disease Father     CABG four vessel  . Diabetes Neg Hx   . Hyperlipidemia Neg Hx    History  Substance Use Topics  . Smoking status: Former Smoker    Quit date: 01/21/1996  . Smokeless tobacco: Never Used     Comment: age 46-26 , up to 1 ppd  . Alcohol Use: Yes     Comment: 30 wine & beer / week   OB History   Grav Para Term Preterm Abortions TAB SAB Ect Mult Living                 Review of Systems  All other systems reviewed and are negative.    Allergies   Review of patient's allergies indicates no known allergies.  Home Medications   Current Outpatient Rx  Name  Route  Sig  Dispense  Refill  . albuterol (PROVENTIL HFA;VENTOLIN HFA) 108 (90 BASE) MCG/ACT inhaler   Inhalation   Inhale 2 puffs into the lungs every 4 (four) hours as needed. 1-2 puffs every 4 hrs prn   8.5 g   1     **APPOINTMENT DUE**   . amoxicillin (AMOXIL) 500 MG capsule   Oral   Take 1 capsule (500 mg total) by mouth 3 (three) times daily.   30 capsule   0   . budesonide-formoterol (SYMBICORT) 80-4.5 MCG/ACT inhaler   Inhalation   Inhale 2 puffs into the lungs 2 (two) times daily. 1-2 puffs every 12 hrs prn ; gargle & spit after use   1 Inhaler   11   . LORazepam (ATIVAN) 0.5 MG tablet   Oral   Take 1 tablet (0.5 mg total) by mouth 2 (two) times daily as needed for anxiety. Office visit due   60 tablet   0    There were no vitals taken for this visit.  Physical Exam General: Well-developed, well-nourished female in no acute distress; appearance consistent with age of record HENT: normocephalic; atraumatic Eyes: pupils equal, round and reactive to light; extraocular muscles intact Neck: supple Heart: regular rate and rhythm; tachycardia Lungs: clear to auscultation bilaterally Abdomen: soft; nondistended; nontender; no masses or hepatosplenomegaly; bowel sounds present Extremities: No deformity; full range of motion; pulses normal (but rapid) Neurologic: Awake, alert and oriented; motor function intact in all extremities and symmetric; no facial droop Skin: Warm and dry Psychiatric: Anxious    ED Course  Procedures (including critical care time)  CARDIOVERSION After informed consent was obtained the patient was cardioverted with 6 mg of adenosine IV push. She converted to sinus tachycardia. There were no immediate complications and the patient tolerated this well.  MDM   Nursing notes and vitals signs, including pulse oximetry,  reviewed.  Summary of this visit's results, reviewed by myself:  Labs:  Results for orders placed during the hospital encounter of 11/21/12 (from the past 24 hour(s))  CBC WITH DIFFERENTIAL     Status: Abnormal   Collection Time    11/21/12 11:00 PM      Result Value Range   WBC 12.9 (*) 4.0 - 10.5 K/uL   RBC 4.45  3.87 - 5.11 MIL/uL   Hemoglobin 12.4  12.0 - 15.0 g/dL   HCT 02.7  25.3 - 66.4 %   MCV 87.0  78.0 - 100.0 fL   MCH 27.9  26.0 - 34.0 pg   MCHC 32.0  30.0 - 36.0 g/dL   RDW 40.3 (*) 47.4 - 25.9 %   Platelets 418 (*) 150 - 400 K/uL   Neutrophils Relative % 41 (*) 43 - 77 %   Neutro Abs 5.3  1.7 - 7.7 K/uL   Lymphocytes Relative 38  12 - 46 %   Lymphs Abs 4.9 (*) 0.7 - 4.0 K/uL   Monocytes Relative 8  3 - 12 %   Monocytes Absolute 1.0  0.1 - 1.0 K/uL   Eosinophils Relative 11 (*) 0 - 5 %   Eosinophils Absolute 1.4 (*) 0.0 - 0.7 K/uL   Basophils Relative 2 (*) 0 - 1 %   Basophils Absolute 0.2 (*) 0.0 - 0.1 K/uL  POCT I-STAT TROPONIN I     Status: None   Collection Time    11/21/12 11:18 PM      Result Value Range   Troponin i, poc 0.00  0.00 - 0.08 ng/mL   Comment 3           POCT I-STAT, CHEM 8     Status: Abnormal   Collection Time    11/21/12 11:21 PM      Result Value Range   Sodium 140  135 - 145 mEq/L   Potassium 4.8  3.5 - 5.1 mEq/L   Chloride 107  96 - 112 mEq/L   BUN 19  6 - 23 mg/dL   Creatinine, Ser 5.63 (*) 0.50 - 1.10 mg/dL   Glucose, Bld 65 (*) 70 - 99 mg/dL   Calcium, Ion 8.75 (*) 1.12 - 1.23 mmol/L   TCO2 26  0 - 100 mmol/L   Hemoglobin 14.6  12.0 - 15.0 g/dL   HCT 64.3  32.9 - 51.8 %      EKG Interpretation (initial)     Ventricular Rate:  176 PR Interval:    QRS Duration: 71 QT Interval:  269 QTC Calculation: 460 R Axis:   68 Text Interpretation:  Supraventricular tachycardia repolarization  abnormality, prob rate related Baseline wander in lead(s) V3 No old tracing to compare     EKG Interpretation (post-cardioversion)      Ventricular Rate:  100 PR Interval:  174 QRS Duration: 74 QT Interval:  347 QTC Calculation: 447 R Axis:   69 Text Interpretation:  Sinus tachycardia Previously SVT     12:19 AM Sinus rhythm continues. Will refer to cardiology.   Hanley Seamen, MD 11/22/12 [phone]

## 2012-11-21 NOTE — ED Notes (Signed)
Pt reports her heart started racing. Consumed a few beers and caffeine today. Started 30 minutes ago. Previous hx of rapid heart rate with exercise.

## 2012-11-22 LAB — TSH: TSH: 2.635 u[IU]/mL (ref 0.350–4.500)

## 2012-11-25 ENCOUNTER — Other Ambulatory Visit: Payer: Self-pay

## 2012-12-03 ENCOUNTER — Encounter: Payer: Self-pay | Admitting: Cardiovascular Disease

## 2012-12-03 ENCOUNTER — Ambulatory Visit (INDEPENDENT_AMBULATORY_CARE_PROVIDER_SITE_OTHER): Payer: BC Managed Care – PPO | Admitting: Cardiovascular Disease

## 2012-12-03 VITALS — BP 150/86 | HR 64 | Resp 20 | Ht 68.0 in | Wt 156.6 lb

## 2012-12-03 DIAGNOSIS — I471 Supraventricular tachycardia: Secondary | ICD-10-CM

## 2012-12-03 DIAGNOSIS — I498 Other specified cardiac arrhythmias: Secondary | ICD-10-CM

## 2012-12-03 MED ORDER — DILTIAZEM HCL 30 MG PO TABS: 30.0000 mg | ORAL_TABLET | Freq: Three times a day (TID) | ORAL | Status: DC

## 2012-12-03 NOTE — Assessment & Plan Note (Signed)
We discussed the pathophysiology behind EDD note reason treat tachycardia as well as his treatment. I reviewed with her the Valsalva maneuver and the diving reflex as methods to abort an ongoing episode. Reducing caffeine intake and other stimulants can help reduce the likelihood of recurrence. I gave her a prescription for diltiazem immediate release which she can use to abort a lengthy episode as well as daily therapy for arrhythmia prevention. She should seek emergency attention if she has associated symptoms such as dyspnea, chest tightness or presyncope. We also reviewed the possibility for curative intervention using radiofrequency ablation, which she is not yet interested in.

## 2012-12-03 NOTE — Patient Instructions (Signed)
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 Episodes can often be interrupted by using the Valsalva maneuver ("bearing down"), or the "diving reflex" (a sudden contact of your face with something very cold).  Diltiazem can be used as an "as needed" medication. Take for prolonged episodes of palpitations that did not resolve after the above maneuvers. It will take about 30 minutes for the medicine to work.  You may also take it to prevent frequent episodes of arrhythmia, in which case it has to be taken 3 times a day.  If the episodes are frequent and very troublesome, we can refer you to an electrophysiologist to discuss catheter ablation, which is curative.  Your physician recommends that you schedule a follow-up appointment in: 12 months

## 2012-12-03 NOTE — Progress Notes (Signed)
Patient ID: Jennifer Stephens, female   DOB: 08/20/1970, 42 y.o.   MRN: [id number]     Reason for office visit Supraventricular tachycardia  This is the first time that Jennifer Stephens has seen a cardiologist. Her mother, Jennifer Stephens, is also my patient.  Over the last 20 years she has had several episodes of rapid palpitations. These occur on the average 2-3 times a year. She does notice they sometimes occur during athletic events, such as a softball game, but also occur randomly. She is usually able to stop the palpitations with a cough or by putting her head between her knees. The onset and the termination of the arrhythmia is always very abrupt. She feels uncomfortable but denies severe dyspnea, chest tightness or syncope during the spells.  About a week ago she had an episode that was considerably longer than usual, lasting 30-45 minutes and not responding to her usual interventions. She presented to the emergency room and had narrow complex, supraventricular tachycardia with a rate of about 160 beats per minute. A single dose of adenosine led to arrhythmia termination.  She has asthma for which he takes an albuterol inhaler.  No Known Allergies  Current Outpatient Prescriptions  Medication Sig Dispense Refill  . albuterol (PROVENTIL HFA;VENTOLIN HFA) 108 (90 BASE) MCG/ACT inhaler Inhale 2 puffs into the lungs every 4 (four) hours as needed. 1-2 puffs every 4 hrs prn  8.5 g  1  . Aspirin-Acetaminophen-Caffeine 260-130-16 MG TABS Take 1 Package by mouth 2 (two) times daily as needed (headahe/pain).      . budesonide-formoterol (SYMBICORT) 80-4.5 MCG/ACT inhaler Inhale 2 puffs into the lungs 2 (two) times daily. 1-2 puffs every 12 hrs prn ; gargle & spit after use  1 Inhaler  11  . ibuprofen (ADVIL,MOTRIN) 200 MG tablet Take 400 mg by mouth every 6 (six) hours as needed for pain.      Marland Kitchen LORazepam (ATIVAN) 0.5 MG tablet Take 0.5 mg by mouth every 8 (eight) hours as needed for anxiety.      Marland Kitchen  diltiazem (CARDIZEM) 30 MG tablet Take 1 tablet (30 mg total) by mouth 3 (three) times daily.  90 tablet  11   No current facility-administered medications for this visit.    Past Medical History  Diagnosis Date  . Asthma   . Palpitations 2002    sinus arrythmia on holter  . Eosinophilia   . Anemia     in context blood donor & vegetarian status  . Abnormal Pap smear 12/2011    Dr Tenny Craw    Past Surgical History  Procedure Laterality Date  . Wisdom tooth extraction  1994  . Flexible sigmoidoscopy  1999    Negative, Dr Virginia Rochester( done for rectal bleeding)  . Gravida 2 para 2      Dr Tenny Craw    Family History  Problem Relation Age of Onset  . Prostate cancer Father   . Heart attack Father 23  . COPD Father   . Emphysema Father   . Coronary artery disease Father     CABG four vessel  . Diabetes Neg Hx   . Hyperlipidemia Neg Hx     History   Social History  . Marital Status: Married    Spouse Name: N/A    Number of Children: N/A  . Years of Education: N/A   Occupational History  . Not on file.   Social History Main Topics  . Smoking status: Former Smoker    Quit date: 01/21/1996  .  Smokeless tobacco: Never Used     Comment: age [age] , up to 1 ppd  . Alcohol Use: Yes     Comment: 30 wine & beer / week  . Drug Use: No  . Sexual Activity: Not on file   Other Topics Concern  . Not on file   Social History Narrative  . No narrative on file    Review of systems: The patient specifically denies any chest pain at rest or with exertion, dyspnea at rest or with exertion, orthopnea, paroxysmal nocturnal dyspnea, syncope, focal neurological deficits, intermittent claudication, lower extremity edema, unexplained weight gain, cough, hemoptysis or wheezing.  The patient also denies abdominal pain, nausea, vomiting, dysphagia, diarrhea, constipation, polyuria, polydipsia, dysuria, hematuria, frequency, urgency, abnormal bleeding or bruising, fever, chills, unexpected weight  changes, mood swings, change in skin or hair texture, change in voice quality, auditory or visual problems, allergic reactions or rashes, new musculoskeletal complaints other than usual "aches and pains".   PHYSICAL EXAM BP 150/86  Pulse 64  Resp 20  Ht 5\' 8"  (1.727 m)  Wt 156 lb 9.6 oz (71.033 kg)  BMI 23.82 kg/m2 I rechecked her blood pressure and it was 137/81 mm Hg General: Alert, oriented x3, no distress Head: no evidence of trauma, PERRL, EOMI, no exophtalmos or lid lag, no myxedema, no xanthelasma; normal ears, nose and oropharynx Neck: normal jugular venous pulsations and no hepatojugular reflux; brisk carotid pulses without delay and no carotid bruits Chest: clear to auscultation, no signs of consolidation by percussion or palpation, normal fremitus, symmetrical and full respiratory excursions Cardiovascular: normal position and quality of the apical impulse, regular rhythm, normal first and second heart sounds, no murmurs, rubs or gallops Abdomen: no tenderness or distention, no masses by palpation, no abnormal pulsatility or arterial bruits, normal bowel sounds, no hepatosplenomegaly Extremities: no clubbing, cyanosis or edema; 2+ radial, ulnar and brachial pulses bilaterally; 2+ right femoral, posterior tibial and dorsalis pedis pulses; 2+ left femoral, posterior tibial and dorsalis pedis pulses; no subclavian or femoral bruits Neurological: grossly nonfocal   EKG: Today her EKG is normal and shows sinus rhythm. ECG in the emergency room shows narrow complex tachycardia with possibly a barely visible retrograde P wave seen as an tiny r' in lead V1.  Lipid Panel     Component Value Date/Time   CHOL 224* 07/18/2010 1348   TRIG 111.0 07/18/2010 1348   HDL 103.20 07/18/2010 1348   CHOLHDL 2 07/18/2010 1348   VLDL 22.2 07/18/2010 1348    BMET    Component Value Date/Time   NA 140 11/21/2012 2321   K 4.8 11/21/2012 2321   CL 107 11/21/2012 2321   CO2 25 07/18/2010 1348   GLUCOSE  65* 11/21/2012 2321   BUN 19 11/21/2012 2321   CREATININE 1.40* 11/21/2012 2321   CALCIUM 9.3 07/18/2010 1348     ASSESSMENT AND PLAN AVNRT (AV nodal re-entry tachycardia) We discussed the pathophysiology behind EDD note reason treat tachycardia as well as his treatment. I reviewed with her the Valsalva maneuver and the diving reflex as methods to abort an ongoing episode. Reducing caffeine intake and other stimulants can help reduce the likelihood of recurrence. I gave her a prescription for diltiazem immediate release which she can use to abort a lengthy episode as well as daily therapy for arrhythmia prevention. She should seek emergency attention if she has associated symptoms such as dyspnea, chest tightness or presyncope. We also reviewed the possibility for curative intervention using radiofrequency ablation,  which she is not yet interested in.   Orders Placed This Encounter  Procedures  . EKG 12-Lead   Meds ordered this encounter  Medications  . LORazepam (ATIVAN) 0.5 MG tablet    Sig: Take 0.5 mg by mouth every 8 (eight) hours as needed for anxiety.  Marland Kitchen diltiazem (CARDIZEM) 30 MG tablet    Sig: Take 1 tablet (30 mg total) by mouth 3 (three) times daily.    Dispense:  90 tablet    Refill:  [address] [place], [facility] [phone] office [phone] pager

## 2013-01-18 ENCOUNTER — Other Ambulatory Visit: Payer: Self-pay | Admitting: Internal Medicine

## 2013-01-18 NOTE — Telephone Encounter (Signed)
Symbicort refilled. JG//CMA

## 2013-03-01 ENCOUNTER — Other Ambulatory Visit (INDEPENDENT_AMBULATORY_CARE_PROVIDER_SITE_OTHER): Payer: BC Managed Care – PPO

## 2013-03-01 ENCOUNTER — Encounter: Payer: Self-pay | Admitting: Internal Medicine

## 2013-03-01 ENCOUNTER — Ambulatory Visit (INDEPENDENT_AMBULATORY_CARE_PROVIDER_SITE_OTHER): Payer: BC Managed Care – PPO | Admitting: Internal Medicine

## 2013-03-01 VITALS — BP 112/80 | HR 78 | Temp 97.4°F | Resp 12 | Ht 67.03 in | Wt 152.2 lb

## 2013-03-01 DIAGNOSIS — Z0000 Encounter for general adult medical examination without abnormal findings: Secondary | ICD-10-CM

## 2013-03-01 DIAGNOSIS — J209 Acute bronchitis, unspecified: Secondary | ICD-10-CM

## 2013-03-01 LAB — BASIC METABOLIC PANEL
BUN: 9 mg/dL (ref 6–23)
CALCIUM: 9.4 mg/dL (ref 8.4–10.5)
CO2: 26 meq/L (ref 19–32)
Chloride: 104 mEq/L (ref 96–112)
Creatinine, Ser: 0.5 mg/dL (ref 0.4–1.2)
GFR: 132.72 mL/min (ref >60.00)
GLUCOSE: 90 mg/dL (ref 70–99)
Potassium: 4 mEq/L (ref 3.5–5.1)
Sodium: 138 mEq/L (ref 135–145)

## 2013-03-01 LAB — CBC WITH DIFFERENTIAL/PLATELET
BASOS ABS: 0 10*3/uL (ref 0.0–0.1)
Basophils Relative: 0.4 % (ref 0.0–3.0)
EOS ABS: 0.1 10*3/uL (ref 0.0–0.7)
Eosinophils Relative: 1.9 % (ref 0.0–5.0)
HCT: 39.4 % (ref 36.0–46.0)
HEMOGLOBIN: 12.4 g/dL (ref 12.0–15.0)
LYMPHS ABS: 1.4 10*3/uL (ref 0.7–4.0)
LYMPHS PCT: 33.3 % (ref 12.0–46.0)
MCHC: 31.5 g/dL (ref 30.0–36.0)
MCV: 82.1 fl (ref 78.0–100.0)
MONOS PCT: 9 % (ref 3.0–12.0)
Monocytes Absolute: 0.4 10*3/uL (ref 0.1–1.0)
NEUTROS ABS: 2.3 10*3/uL (ref 1.4–7.7)
Neutrophils Relative %: 55.4 % (ref 43.0–77.0)
PLATELETS: 248 10*3/uL (ref 150.0–400.0)
RBC: 4.8 Mil/uL (ref 3.87–5.11)
RDW: 19 % — ABNORMAL HIGH (ref 11.5–14.6)
WBC: 4.1 10*3/uL — ABNORMAL LOW (ref 4.5–10.5)

## 2013-03-01 LAB — HEPATIC FUNCTION PANEL
ALBUMIN: 4 g/dL (ref 3.5–5.2)
ALK PHOS: 45 U/L (ref 39–117)
ALT: 25 U/L (ref 0–35)
AST: 25 U/L (ref 0–37)
BILIRUBIN TOTAL: 0.9 mg/dL (ref 0.3–1.2)
Bilirubin, Direct: 0.1 mg/dL (ref 0.0–0.3)
Total Protein: 7.7 g/dL (ref 6.0–8.3)

## 2013-03-01 LAB — LIPID PANEL
CHOL/HDL RATIO: 2
CHOLESTEROL: 204 mg/dL — AB (ref 0–200)
HDL: 89 mg/dL (ref >39.00)
Triglycerides: 150 mg/dL — ABNORMAL HIGH (ref 0.0–149.0)
VLDL: 30 mg/dL (ref 0.0–40.0)

## 2013-03-01 LAB — LDL CHOLESTEROL, DIRECT: Direct LDL: 69.4 mg/dL

## 2013-03-01 MED ORDER — LORAZEPAM 0.5 MG PO TABS: 0.5000 mg | ORAL_TABLET | Freq: Three times a day (TID) | ORAL | Status: DC | PRN

## 2013-03-01 MED ORDER — AMOXICILLIN 500 MG PO CAPS: 500.0000 mg | ORAL_CAPSULE | Freq: Three times a day (TID) | ORAL | Status: DC

## 2013-03-01 NOTE — Progress Notes (Signed)
Pre-visit discussion using our clinic review tool. No additional management support is needed unless otherwise documented below in the visit note.  

## 2013-03-01 NOTE — Patient Instructions (Addendum)
Your next office appointment will be determined based upon review of your pending labs . Those instructions will be transmitted to you through My Chart . Alcohol If you drink, do it moderately - 1 drink per day or less.  Depression is common in our stressful world. If you're feeling down or losing interest in things you normally enjoy, please be seen. Do not take the Lorazepam with alcohol.

## 2013-03-01 NOTE — Progress Notes (Signed)
   Subjective:    Patient ID: Jennifer Stephens, female    DOB: 07/31/1970, 43 y.o.   MRN: [id number]  HPI   She is here for a physical;acute issues include resolving RTI.     Review of Systems   Her symptoms began 02/20/13 as chest congestion associated with diffuse aching, rhinitis & sinus pressure.Her  symptoms have improved with Dayquil ;although she has a residual cough with yellow sputum. No significant asthma flare. Low grade fever up to 100.5 for 3 days.As of 01/27/13 she has had laryngitis.  She has not had frontal headache, facial pain, or purulent nasal secretions.  Her "boyfriend" just ended relationship. See alcohol intake.     Objective:   Physical Exam Gen.: Healthy and well-nourished in appearance. Alert, appropriate and cooperative throughout exam. Appears younger than stated age  Head: Normocephalic without obvious abnormalities  Eyes: No corneal or conjunctival inflammation noted. Pupils equal round reactive to light and accommodation. Extraocular motion intact.  Ears: External  ear exam reveals no significant lesions or deformities. Canals clear .TMs normal. Hearing is grossly normal bilaterally. Nose: External nasal exam reveals no deformity or inflammation. Nasal mucosa are pink and moist. No lesions or exudates noted.   Mouth: Oral mucosa and oropharynx reveal no lesions or exudates. Teeth in good repair. Neck: No deformities, masses, or tenderness noted. Range of motion & Thyroid normal. Lungs: Normal respiratory effort; chest expands symmetrically. Lungs are clear to auscultation without rales, wheezes, or increased work of breathing. Heart: Normal rate and rhythm. Normal S1 and S2. No gallop, click, or rub. S4 w/o murmur. Abdomen: Bowel sounds normal; abdomen soft and nontender. No masses, organomegaly or hernias noted. Genitalia: as per Gyn                                  Musculoskeletal/extremities: No deformity or scoliosis noted of  the thoracic or lumbar spine.   No clubbing, cyanosis, edema, or significant extremity  deformity noted. Range of motion normal .Tone & strength normal. Hand joints normal . Fingernail  health good. Able to lie down & sit up w/o help. Negative SLR bilaterally Vascular: Carotid, radial artery, dorsalis pedis and  posterior tibial pulses are full and equal. No bruits present. Neurologic: Alert and oriented x3. Deep tendon reflexes symmetrical and normal.      Skin: Intact without suspicious lesions or rashes. Lymph: No cervical, axillary lymphadenopathy present. Psych: Mood and affect are normal. Normally interactive                                                                                        Assessment & Plan:  #1 comprehensive physical exam; no acute findings #2 resolving RTI  Plan: see Orders  & Recommendations

## 2013-07-05 ENCOUNTER — Telehealth: Payer: Self-pay

## 2013-07-05 ENCOUNTER — Other Ambulatory Visit: Payer: Self-pay | Admitting: Internal Medicine

## 2013-07-05 NOTE — Telephone Encounter (Signed)
Pt called asking if we had a sample of symbicort 80-4.5 to hold her over until she get her rx from the pharmacy tomorrow.  We do not have that particular sample in office.  Pt notified

## 2013-08-05 ENCOUNTER — Other Ambulatory Visit: Payer: Self-pay | Admitting: Obstetrics and Gynecology

## 2013-08-11 LAB — CYTOLOGY - PAP

## 2014-01-18 ENCOUNTER — Other Ambulatory Visit: Payer: Self-pay | Admitting: Internal Medicine

## 2014-01-18 NOTE — Telephone Encounter (Signed)
[facility]

## 2014-01-18 NOTE — Telephone Encounter (Signed)
OK #[phone] 

## 2014-01-23 ENCOUNTER — Ambulatory Visit (INDEPENDENT_AMBULATORY_CARE_PROVIDER_SITE_OTHER): Payer: BLUE CROSS/BLUE SHIELD | Admitting: Physician Assistant

## 2014-01-23 ENCOUNTER — Encounter: Payer: Self-pay | Admitting: Physician Assistant

## 2014-01-23 VITALS — BP 138/84 | HR 79 | Ht 68.0 in | Wt 166.4 lb

## 2014-01-23 DIAGNOSIS — F419 Anxiety disorder, unspecified: Secondary | ICD-10-CM

## 2014-01-23 DIAGNOSIS — I471 Supraventricular tachycardia: Secondary | ICD-10-CM

## 2014-01-23 DIAGNOSIS — R002 Palpitations: Secondary | ICD-10-CM

## 2014-01-23 MED ORDER — DILTIAZEM HCL 30 MG PO TABS
ORAL_TABLET | ORAL | Status: DC
Start: 2014-01-23 — End: 2014-03-02

## 2014-01-23 NOTE — Assessment & Plan Note (Signed)
Patient reports having 4 episodes in the last year the last of which was thanksgiving. She's been able to terminate these on her own without the use of Cardizem which she did not fill anyway.

## 2014-01-23 NOTE — Assessment & Plan Note (Signed)
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   There are for the most part asymptomatic. However, the patient did report some mild dizziness on Saturday.  I have given her another prescription for diltiazem, which she can use when necessary for her AV nodal reentrant tachycardia.  We'll tentatively order a 1 week and we will research her insurance and see if it'll cover it.

## 2014-01-23 NOTE — Assessment & Plan Note (Signed)
Some of her anxiety has been relieved with lorazepam which she got from her primary care doctor.

## 2014-01-23 NOTE — Progress Notes (Signed)
Patient ID: Jennifer Stephens, female   DOB: November 14, 1970, 43 y.o.   MRN: [id number]    Date:  01/23/2014   ID:  Jennifer Stephens, DOB [birth year], MRN [id number]  PCP:  Marga Melnick, MD  Primary Cardiologist:  Cro[facility]    History of Present Illness: ASA FATH is a 44 y.o. female was seen last by Dr. Royann Shivers November 2014 at which time she was diagnosed with AV nodal reentrant tachycardia .  Over the last 20 years she had noticed several episodes of rapid palpitations. These occurred on the average 2-3 times a year and sometimes occurred during athletic events, such as a softball game, but also occur randomly. She is usually able to stop the palpitations with a cough or by putting her head between her knees. The onset and the termination of the arrhythmia is always very abrupt. She felt uncomfortable but denied severe dyspnea, chest tightness or syncope during the spells.  Patient presents today again for evaluation of "skipping, Heavy" heart beat.  She reports being under a lot of stress and was very tearful in the office today.  She is a single mom with two children(11, 16) and not having a family, or significant other, to spend the holidays with has affected her.  Ativan has been helping the anxiety but not the heart beats.  She has only had four episodes of the AVNRT this year and the last was thanksgiving.  She did not fill the cardizem script.   On Saturday she had a short period of mild dizziness.  No presyncope.  Rare chest discomfort.    The patient currently denies nausea, vomiting, fever, shortness of breath, orthopnea, PND, cough, congestion, abdominal pain, hematochezia, melena, lower extremity edema, claudication.  Wt Readings from Last 3 Encounters:  01/23/14 166 lb 6.4 oz (75.479 kg)  03/01/13 152 lb 4 oz (69.06 kg)  12/03/12 156 lb 9.6 oz (71.033 kg)     Past Medical History  Diagnosis Date  . Asthma   . Palpitations 2002    sinus arrythmia on holter  . Eosinophilia   . Anemia      in context blood donor & vegetarian status  . Abnormal Pap smear 12/2011    Dr Tenny Craw    Current Outpatient Prescriptions  Medication Sig Dispense Refill  . albuterol (PROVENTIL HFA;VENTOLIN HFA) 108 (90 BASE) MCG/ACT inhaler Inhale 2 puffs into the lungs every 4 (four) hours as needed. 1-2 puffs every 4 hrs prn 8.5 g 1  . Aspirin-Acetaminophen-Caffeine 260-130-16 MG TABS Take 1 Package by mouth 2 (two) times daily as needed (headahe/pain).    . budesonide-formoterol (SYMBICORT) 80-4.5 MCG/ACT inhaler Inhale 2 puffs into the lungs 2 (two) times daily. 1-2 puffs every 12 hrs prn ; gargle & spit after use 1 Inhaler 11  . ibuprofen (ADVIL,MOTRIN) 200 MG tablet Take 400 mg by mouth every 6 (six) hours as needed for pain.    Marland Kitchen LORazepam (ATIVAN) 0.5 MG tablet TAKE ONE TABLET BY MOUTH EVERY 8 HOURS AS NEEDED FOR ANXIETY 30 tablet 0  . SYMBICORT 80-4.5 MCG/ACT inhaler INHALE ONE(1) TO TWO(2) PUFFS INTO LUNGS EVERY 12 HOURS (SPIT AND GARGLE AFTER USE) 10.2 g 11  . diltiazem (CARDIZEM) 30 MG tablet As needed for increased heart rate. 90 tablet 6   No current facility-administered medications for this visit.    Allergies:   No Known Allergies  Social History:  The patient  reports that she quit smoking about 18 years ago. She has never  used smokeless tobacco. She reports that she drinks alcohol. She reports that she does not use illicit drugs.   Family history:   Family History  Problem Relation Age of Onset  . Prostate cancer Father   . Heart attack Father 18  . Emphysema Father   . Coronary artery disease Father     CABG four vessel  . Diabetes Neg Hx   . Hyperlipidemia Neg Hx   . Stroke Neg Hx     ROS:  Please see the history of present illness.  All other systems reviewed and negative.   PHYSICAL EXAM: VS:  BP 138/84 mmHg  Pulse 79  Ht  (1.727 m)  Wt 166 lb 6.4 oz (75.479 kg)  BMI 25.31 kg/m2 Well nourished, well developed, in no acute distress HEENT: Pupils are equal  round react to light accommodation extraocular movements are intact.  Neck: no JVDNo cervical lymphadenopathy. Cardiac: Regular rate and rhythm without murmurs rubs or gallops. Lungs:  clear to auscultation bilaterally, no wheezing, rhonchi or rales Ext: no lower extremity edema.  2+ radial and dorsalis pedis pulses. Skin: warm and dry Neuro:  Grossly normal  EKG:  Normal sinus rhythm rate 79 bpm septal Q waves     ASSESSMENT AND PLAN:  Problem List Items Addressed This Visit    Heart palpitations - Primary    These palpitations or "hard heart beat", as the patient describes them, are different than her AV nodal reentrant tachycardia.   There are for the most part asymptomatic. However, the patient did report some mild dizziness on Saturday.  I have given her another prescription for diltiazem, which she can use when necessary for her AV nodal reentrant tachycardia.  We'll tentatively order a 1 week and we will research her insurance and see if it'll cover it.    Relevant Orders      EKG 12-Lead      Cardiac event monitor   AVNRT (AV nodal re-entry tachycardia)    Patient reports having 4 episodes in the last year the last of which was thanksgiving. She's been able to terminate these on her own without the use of Cardizem which she did not fill anyway.    Relevant Medications      diltiazem (CARDIZEM) tablet   Anxiety    Some of her anxiety has been relieved with lorazepam which she got from her primary care doctor.

## 2014-01-23 NOTE — Patient Instructions (Signed)
Please make an appointment on the Nurse's schedule in a week to come back in to have the heart monitor placed.  Please follow up with Dr. Royann Shivers in 3 months.  Your Doctor has ordered you to wear a heart monitor. You will wear this for 7 days.   TIPS -  REMINDERS 1. The sensor is the lanyard that is worn around your neck every day - this is powered by a battery that needs to be changed every day 2. The monitor is the device that allows you to record symptoms - this will need to be charged daily 3. The sensor & monitor need to be within 100 feet of each other at all times 4. The sensor connects to the electrodes (stickers) - these should be changed every 24-48 hours (you do not have to remove them when you bathe, just make sure they are dry when you connect it back to the sensor 5. If you need more supplies (electrodes, batteries), please call the 1-800 # on the back of the pamphlet and [employer] will mail you more supplies 6. If your skin becomes sensitive, please try the sample pack of sensitive skin electrodes (the white packet in your silver box) and call [employer] to have them mail you more of these type of electrodes 7. When you are finish wearing the monitor, please place all supplies back in the silver box, place the silver box in the pre-packaged UPS bag and drop off at [employer] or call them so they can come pick it up   Cardiac Event Monitoring A cardiac event monitor is a small recording device used to help detect abnormal heart rhythms (arrhythmias). The monitor is used to record heart rhythm when noticeable symptoms such as the following occur:  Fast heartbeats (palpitations), such as heart racing or fluttering.  Dizziness.  Fainting or light-headedness.  Unexplained weakness. The monitor is wired to two electrodes placed on your chest. Electrodes are flat, sticky disks that attach to your skin. The monitor can be worn for up to 30 days. You will wear the monitor at all times,  except when bathing.  HOW TO USE YOUR CARDIAC EVENT MONITOR A technician will prepare your chest for the electrode placement. The technician will show you how to place the electrodes, how to work the monitor, and how to replace the batteries. Take time to practice using the monitor before you leave the office. Make sure you understand how to send the information from the monitor to your health care provider. This requires a telephone with a landline, not a cell phone. You need to:  Wear your monitor at all times, except when you are in water:  Do not get the monitor wet.  Take the monitor off when bathing. Do not swim or use a hot tub with it on.  Keep your skin clean. Do not put body lotion or moisturizer on your chest.  Change the electrodes daily or any time they stop sticking to your skin. You might need to use tape to keep them on.  It is possible that your skin under the electrodes could become irritated. To keep this from happening, try to put the electrodes in slightly different places on your chest. However, they must remain in the area under your left breast and in the upper right section of your chest.  Make sure the monitor is safely clipped to your clothing or in a location close to your body that your health care provider recommends.  Press the  button to record when you feel symptoms of heart trouble, such as dizziness, weakness, light-headedness, palpitations, thumping, shortness of breath, unexplained weakness, or a fluttering or racing heart. The monitor is always on and records what happened slightly before you pressed the button, so do not worry about being too late to get good information.  Keep a diary of your activities, such as walking, doing chores, and taking medicine. It is especially important to note what you were doing when you pushed the button to record your symptoms. This will help your health care provider determine what might be contributing to your symptoms. The  information stored in your monitor will be reviewed by your health care provider alongside your diary entries.  Send the recorded information as recommended by your health care provider. It is important to understand that it will take some time for your health care provider to process the results.  Change the batteries as recommended by your health care provider. SEEK IMMEDIATE MEDICAL CARE IF:   You have chest pain.  You have extreme difficulty breathing or shortness of breath.  You develop a very fast heartbeat that persists.  You develop dizziness that does not go away.  You faint or constantly feel you are about to faint. Document Released: 10/16/2007 Document Revised: 05/23/2013 Document Reviewed: 07/05/2012 [facility] Patient Information 2015 [place], [place]. This information is not intended to replace advice given to you by your health care provider. Make sure you discuss any questions you have with your health care provider.

## 2014-01-26 ENCOUNTER — Telehealth: Payer: Self-pay | Admitting: Internal Medicine

## 2014-01-26 NOTE — Telephone Encounter (Signed)
Please make appointment sooner; Xanax is one of the most addictive meds; there are more effective & safer alternatives

## 2014-01-26 NOTE — Telephone Encounter (Signed)
Patient has been advised via voicemail  

## 2014-01-26 NOTE — Telephone Encounter (Signed)
Patient states she has an appointment to see you regarding this on February 11.

## 2014-01-26 NOTE — Telephone Encounter (Signed)
Patient states she is currently taking lorazepam and states its not working.  She is requesting a script for xanax please advise.

## 2014-01-26 NOTE — Telephone Encounter (Signed)
Phone call to patient and let her know she needs to schedule an appointment to review her medication (not a physical) with Dr Alwyn RenHopper.

## 2014-01-26 NOTE — Telephone Encounter (Signed)
This is significant ; office visit needed . Other evaluation may be needed

## 2014-01-30 ENCOUNTER — Ambulatory Visit (INDEPENDENT_AMBULATORY_CARE_PROVIDER_SITE_OTHER): Payer: BLUE CROSS/BLUE SHIELD | Admitting: Internal Medicine

## 2014-01-30 ENCOUNTER — Other Ambulatory Visit (INDEPENDENT_AMBULATORY_CARE_PROVIDER_SITE_OTHER): Payer: BLUE CROSS/BLUE SHIELD

## 2014-01-30 ENCOUNTER — Encounter: Payer: Self-pay | Admitting: Internal Medicine

## 2014-01-30 VITALS — BP 142/92 | HR 70 | Temp 98.2°F | Ht 68.0 in | Wt 163.0 lb

## 2014-01-30 DIAGNOSIS — F4322 Adjustment disorder with anxiety: Secondary | ICD-10-CM

## 2014-01-30 DIAGNOSIS — R635 Abnormal weight gain: Secondary | ICD-10-CM

## 2014-01-30 LAB — TSH: TSH: 1.73 u[IU]/mL (ref 0.35–4.50)

## 2014-01-30 MED ORDER — CLONAZEPAM 0.5 MG PO TABS: 0.5000 mg | ORAL_TABLET | Freq: Two times a day (BID) | ORAL | Status: DC | PRN

## 2014-01-30 MED ORDER — ESCITALOPRAM OXALATE 20 MG PO TABS: 20.0000 mg | ORAL_TABLET | Freq: Every day | ORAL | Status: DC

## 2014-01-30 NOTE — Progress Notes (Signed)
Pre visit review using our clinic review tool, if applicable. No additional management support is needed unless otherwise documented below in the visit note. 

## 2014-01-30 NOTE — Patient Instructions (Signed)
Please reconsider taking the agent to raise the neurotransmitters which are essential for good brain function, both intellectual & emotional health. These agents are not addictive and simply keep this essential neurotransmitter at therapeutic levels. If these levels become severely depleted; depression or panic attacks can occur.    

## 2014-01-30 NOTE — Progress Notes (Signed)
   Subjective:    Patient ID: Jennifer RodneyBeth A Stephens, female    DOB: 1970/03/29, 44 y.o.   MRN: [id number]  HPI   She is under extreme stress with anxiety & some panic attacks.She has requested Xanax as Lorazepam has not been effective lately.  She is now divorced & her husband has "moved on" . She is under financial stress working 4 jobs. 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  She has 2 girls 16 and 11. There are doing as well as could be expected with the family situation.  Several years ago she was on Lexapro with benefit during a period of marital strain.   She is walking , employing weights & she also does calisthenics. She has no symptoms with that  She is drinking 3 beers a day.  She has some occasional headache.  Family history includes alcoholism in her father. There are no other mental health issues  Her weight is up 10 pounds.    Review of Systems She denies anorexia, insomnia, or suicidal ideation.  The headaches are not associated with flushing, chest pain, and diarrhea.  Chest pain, palpitations, tachycardia, exertional dyspnea, paroxysmal nocturnal dyspnea, claudication or edema are absent.      Objective:   Physical Exam   Gen.:  well-nourished; in no acute distress Eyes: Extraocular motion intact; no lid lag or proptosis ,no nystagmus Neck: full ROM; no masses ; thyroid normal  Heart: Normal rhythm and rate without significant murmur, gallop, or extra heart sounds Lungs: Chest clear to auscultation without rales,rales, wheezes Neuro:Deep tendon reflexes are equal and within normal limits; no tremor  Skin: Warm and dry without significant lesions or rashes; no onycholysis Lymphatic: no cervical or axillary LA Psych: Normally communicative and interactive; no abnormal mood or affect clinically.         Assessment & Plan:  #1 situational anxiety #2 weight gain  The pathophysiology of  neurotransmitter deficiency was discussed along with the benefits and potential adverse effects of SSRI therapy. See orders

## 2014-03-01 ENCOUNTER — Other Ambulatory Visit: Payer: Self-pay | Admitting: Obstetrics and Gynecology

## 2014-03-02 ENCOUNTER — Encounter: Payer: Self-pay | Admitting: Internal Medicine

## 2014-03-02 ENCOUNTER — Other Ambulatory Visit (INDEPENDENT_AMBULATORY_CARE_PROVIDER_SITE_OTHER): Payer: BLUE CROSS/BLUE SHIELD

## 2014-03-02 ENCOUNTER — Ambulatory Visit (INDEPENDENT_AMBULATORY_CARE_PROVIDER_SITE_OTHER): Payer: BLUE CROSS/BLUE SHIELD | Admitting: Internal Medicine

## 2014-03-02 VITALS — BP 126/90 | HR 73 | Temp 98.2°F | Resp 12 | Ht 68.0 in | Wt 167.2 lb

## 2014-03-02 DIAGNOSIS — Z0189 Encounter for other specified special examinations: Secondary | ICD-10-CM

## 2014-03-02 DIAGNOSIS — Z0000 Encounter for general adult medical examination without abnormal findings: Secondary | ICD-10-CM

## 2014-03-02 DIAGNOSIS — J4520 Mild intermittent asthma, uncomplicated: Secondary | ICD-10-CM

## 2014-03-02 LAB — CYTOLOGY - PAP

## 2014-03-02 LAB — CBC WITH DIFFERENTIAL/PLATELET
BASOS PCT: 1.1 % (ref 0.0–3.0)
Basophils Absolute: 0.1 10*3/uL (ref 0.0–0.1)
EOS ABS: 0.6 10*3/uL (ref 0.0–0.7)
Eosinophils Relative: 11.5 % — ABNORMAL HIGH (ref 0.0–5.0)
HEMATOCRIT: 32.5 % — AB (ref 36.0–46.0)
Hemoglobin: 10.8 g/dL — ABNORMAL LOW (ref 12.0–15.0)
Lymphocytes Relative: 23.8 % (ref 12.0–46.0)
Lymphs Abs: 1.3 10*3/uL (ref 0.7–4.0)
MCHC: 33.2 g/dL (ref 30.0–36.0)
MCV: 81.4 fl (ref 78.0–100.0)
MONO ABS: 0.4 10*3/uL (ref 0.1–1.0)
Monocytes Relative: 8.1 % (ref 3.0–12.0)
Neutro Abs: 3 10*3/uL (ref 1.4–7.7)
Neutrophils Relative %: 55.5 % (ref 43.0–77.0)
Platelets: 258 10*3/uL (ref 150.0–400.0)
RBC: 3.99 Mil/uL (ref 3.87–5.11)
RDW: 17.9 % — ABNORMAL HIGH (ref 11.5–15.5)
WBC: 5.5 10*3/uL (ref 4.0–10.5)

## 2014-03-02 LAB — HEPATIC FUNCTION PANEL
ALT: 26 U/L (ref 0–35)
AST: 27 U/L (ref 0–37)
Albumin: 4.2 g/dL (ref 3.5–5.2)
Alkaline Phosphatase: 40 U/L (ref 39–117)
BILIRUBIN DIRECT: 0.1 mg/dL (ref 0.0–0.3)
TOTAL PROTEIN: 7.4 g/dL (ref 6.0–8.3)
Total Bilirubin: 0.4 mg/dL (ref 0.2–1.2)

## 2014-03-02 LAB — BASIC METABOLIC PANEL
BUN: 13 mg/dL (ref 6–23)
CO2: 26 mEq/L (ref 19–32)
Calcium: 9.2 mg/dL (ref 8.4–10.5)
Chloride: 102 mEq/L (ref 96–112)
Creatinine, Ser: 0.61 mg/dL (ref 0.40–1.20)
GFR: 112.34 mL/min (ref >60.00)
Glucose, Bld: 86 mg/dL (ref 70–99)
Potassium: 4.1 mEq/L (ref 3.5–5.1)
Sodium: 135 mEq/L (ref 135–145)

## 2014-03-02 NOTE — Progress Notes (Signed)
Pre visit review using our clinic review tool, if applicable. No additional management support is needed unless otherwise documented below in the visit note. 

## 2014-03-02 NOTE — Progress Notes (Signed)
   Subjective:    Patient ID: Jennifer Stephens, female    DOB: [birth year], 44 y.o.   MRN: [id number]  HPI  She is here for a physical;acute issues include residual depression related to her marital issues.  She is on a heart healthy diet; she walks 30 minutes a day weather permitting. She has no associated cardiopulmonary symptoms  Her asthma is well controlled. She uses her rescue inhaler less than 2 times a week. She's only using a low-dose Symbicort once a day.  She has had a history of anemia in the past in the context of being on a vegetarian diet  for a period time and also being a blood donor. She has no active GI symptoms.    Review of Systems  Chest pain, palpitations, tachycardia, exertional dyspnea, paroxysmal nocturnal dyspnea, claudication or edema are absent.  Unexplained weight loss, abdominal pain, significant dyspepsia, dysphagia, melena, rectal bleeding, or persistently small caliber stools are denied.     Objective:   Physical Exam  Gen.: Adequately nourished in appearance. Alert, appropriate and cooperative throughout exam. Appears younger than stated age   Head: Normocephalic without obvious abnormalities  Eyes: No corneal or conjunctival inflammation noted. Pupils equal round reactive to light and accommodation. Extraocular motion intact.  Ears: External  ear exam reveals no significant lesions or deformities. Canals clear .TMs normal. Hearing is grossly normal bilaterally. Nose: External nasal exam reveals no deformity or inflammation. Nasal mucosa are pink and moist. No lesions or exudates noted.   Mouth: Oral mucosa and oropharynx reveal no lesions or exudates. Teeth in good repair. Neck: No deformities, masses, or tenderness noted. Range of motion .Thyroid exhibits physiologic asymmetry ; R lobe larger than L. Lungs: Normal respiratory effort; chest expands symmetrically. Lungs are clear to auscultation without rales, wheezes, or increased work of  breathing. Heart: Normal rate and rhythm. Normal S1 and S2. No gallop, click, or rub. No murmur. Abdomen: Bowel sounds normal; abdomen soft and nontender. No masses, organomegaly or hernias noted. Genitalia:  as per Gyn                                  Musculoskeletal/extremities: No deformity or scoliosis noted of  the thoracic or lumbar spine.  No clubbing, cyanosis, edema, or significant extremity  deformity noted.  Range of motion normal . Tone & strength normal. Hand joints normal Fingernail  health good. Able to lie down & sit up w/o help.  Negative SLR bilaterally Vascular: Carotid, radial artery, dorsalis pedis and  posterior tibial pulses are full and equal. No bruits present. Neurologic: Alert and oriented x3. Deep tendon reflexes symmetrical and normal.  Gait normal   Skin: Intact without suspicious lesions or rashes. Lymph: No cervical, axillary lymphadenopathy present. Psych: Mood and affect are normal. Normally interactive                                                                                      Assessment & Plan:  #1 comprehensive physical exam; no acute findings  Plan: see Orders  & Recommendations

## 2014-03-02 NOTE — Patient Instructions (Signed)
  Your next office appointment will be determined based upon review of your pending labs . Those instructions will be transmitted to you through My Chart Followup as needed for any active or acute issue. Please report any significant change in your symptoms.

## 2014-03-06 ENCOUNTER — Other Ambulatory Visit: Payer: Self-pay | Admitting: Internal Medicine

## 2014-03-06 DIAGNOSIS — D6489 Other specified anemias: Secondary | ICD-10-CM

## 2014-04-25 ENCOUNTER — Other Ambulatory Visit: Payer: Self-pay | Admitting: Internal Medicine

## 2014-04-25 NOTE — Telephone Encounter (Signed)
OK x 3 mos 

## 2014-07-28 ENCOUNTER — Other Ambulatory Visit: Payer: Self-pay | Admitting: Internal Medicine

## 2014-07-28 ENCOUNTER — Other Ambulatory Visit: Payer: Self-pay

## 2014-07-28 DIAGNOSIS — J45909 Unspecified asthma, uncomplicated: Secondary | ICD-10-CM

## 2014-07-28 MED ORDER — BUDESONIDE-FORMOTEROL FUMARATE 80-4.5 MCG/ACT IN AERO: 2.0000 | INHALATION_SPRAY | Freq: Two times a day (BID) | RESPIRATORY_TRACT | Status: DC

## 2014-08-01 ENCOUNTER — Other Ambulatory Visit: Payer: Self-pay | Admitting: Emergency Medicine

## 2014-08-01 MED ORDER — BUDESONIDE-FORMOTEROL FUMARATE 80-4.5 MCG/ACT IN AERO: INHALATION_SPRAY | RESPIRATORY_TRACT | Status: DC

## 2014-08-30 ENCOUNTER — Telehealth: Payer: Self-pay | Admitting: *Deleted

## 2014-08-30 NOTE — Telephone Encounter (Signed)
Left msg on triage stating she is having problems concentrating wanting to see if md can rx adderral for her to help. Called pt back no answer LMOM will need to make appt to discuss with md before med can be rx...Raechel Chute

## 2014-08-31 ENCOUNTER — Encounter: Payer: Self-pay | Admitting: Internal Medicine

## 2014-08-31 ENCOUNTER — Ambulatory Visit (INDEPENDENT_AMBULATORY_CARE_PROVIDER_SITE_OTHER): Payer: BLUE CROSS/BLUE SHIELD | Admitting: Internal Medicine

## 2014-08-31 VITALS — BP 148/90 | HR 84 | Temp 98.9°F | Resp 18 | Wt 173.0 lb

## 2014-08-31 DIAGNOSIS — F410 Panic disorder [episodic paroxysmal anxiety] without agoraphobia: Secondary | ICD-10-CM

## 2014-08-31 DIAGNOSIS — F4322 Adjustment disorder with anxiety: Secondary | ICD-10-CM

## 2014-08-31 MED ORDER — DULOXETINE HCL 30 MG PO CPEP
30.0000 mg | ORAL_CAPSULE | Freq: Every day | ORAL | Status: DC
Start: 2014-08-31 — End: 2015-07-16

## 2014-08-31 NOTE — Progress Notes (Signed)
Pre visit review using our clinic review tool, if applicable. No additional management support is needed unless otherwise documented below in the visit note. 

## 2014-08-31 NOTE — Progress Notes (Signed)
   Subjective:    Patient ID: Jennifer Stephens, female    DOB: 08-Dec-1970, 44 y.o.   MRN: [id number]  HPI She is requesting medication to " help (me) focus". She describes worrying and intermittent panic attacks. She states she "worries all the time". She comments that she "gives, gives, and gives". These issues have been worse as her 50 year old daughter now drives and "she doesn't need me". She's been divorced for 2 years but was separated for a year before the actual divorce. She had had an affair because she was unhappy in her marriage.She states she would not have asked for a divorce had she known "how hard it would be".  She does stay active,exercising 5 days a week for at least 30 minutes. Her diet is heart healthy but she's continued to gain weight. Thyroid functions were normal in January of this year.  She does drink 4-5 beers per day and wine occasionally.  She had been on Lexapro but weaned herself off it .She states she "did not want to take something daily". She also questioned whether it really helped.  Her father was an alcoholic. There is no other family mental health history. She denies any history of manic depression or bipolar dear family (see MDQ results below).   She denies suicidal ideation.   Review of Systems She has variable sleep pattern.  She also describes hot flashes.  She has a new lesion on the left breast which concerns her.    Objective:   Physical Exam Pertinent or positive findings include: She has a tiny keratotic lesion over the left breast which has no malignant character. She is intermittently tearful.  Mood Disorder questionnaire revealed 10 positive answers. She classifies these as a minor problem.  General appearance :adequately nourished; in no distress.  Eyes: No conjunctival inflammation or scleral icterus is present. EOMI; no lid lag or proptosis  Oral exam:  Lips and gums are healthy appearing.There is no oropharyngeal erythema or exudate  noted. Dental hygiene is good.  Heart:  Normal rate and regular rhythm. S1 and S2 normal without gallop, murmur, click, rub or other extra sounds    Lungs:Chest clear to auscultation; no wheezes, rhonchi,rales ,or rubs present.No increased work of breathing.   Abdomen: bowel sounds normal, soft and non-tender without masses, organomegaly or hernias noted.  No guarding or rebound.   Vascular : all pulses equal ; no bruits present.  Skin:Warm & dry.  Intact without suspicious lesions or rashes ; no tenting or jaundice   Lymphatic: No lymphadenopathy is noted about the head, neck, axilla  Neuro: Strength, tone & DTRs normal.       Assessment & Plan:  #1 anxiety disorder with panic attacks. Suboptimal response to Lexapro. MDQ screen results worrisome for possible bipolar disorder  Plan: The pathophysiology of neurotransmitter imbalance was discussed. A trial of Cymbalta will be initiated. Another option would be to consider Wellbutrin because of family history ; her difficulty with focus and her significant alcohol consumption.  If she fails to respond to either option; Psychiatry referral is recommended.

## 2014-08-31 NOTE — Patient Instructions (Signed)
Please reconsider taking the agent to raise the neurotransmitters which are essential for good brain function, both intellectual & emotional health. These agents are not addictive and simply keep these essential neurotransmitters at therapeutic levels. If these levels become severely depleted; depression or panic attacks can occur.

## 2014-09-01 ENCOUNTER — Encounter: Payer: Self-pay | Admitting: Internal Medicine

## 2014-09-14 ENCOUNTER — Telehealth: Payer: Self-pay | Admitting: Internal Medicine

## 2014-09-14 NOTE — Telephone Encounter (Signed)
Patient called in stating she does not want to take the DULoxetine (CYMBALTA) 30 MG capsule [[id number]] or clonazePAM (KLONOPIN) 0.5 MG tablet [[id number]  She feels that she does best with Lorazepam.  She is requesting a prescription for the Lorazepam to take as needed

## 2014-09-14 NOTE — Telephone Encounter (Signed)
Please advise 

## 2014-09-15 MED ORDER — LORAZEPAM 0.5 MG PO TABS: 0.5000 mg | ORAL_TABLET | Freq: Three times a day (TID) | ORAL | Status: DC | PRN

## 2014-09-15 NOTE — Telephone Encounter (Signed)
LVM informing pt of Hopps note. RX sent in to pharm

## 2014-09-15 NOTE — Telephone Encounter (Signed)
lorazepam 0.5 mg q 8-12 hrs prn only # 30 This medication is among those which experts have documented to have a very  high risk of affecting  mental  alertness  & balance. This medication should be taken as infrequently as possible and @  the lowest possible dose.It should not be taken with alcohol, sedatives  or other agents which have a similar  adverse risk potential. If taken on a regular basis ;it can be habit forming.

## 2014-11-17 ENCOUNTER — Telehealth: Payer: Self-pay

## 2014-11-17 NOTE — Telephone Encounter (Signed)
PA started via cover my meds.   KEY: [id number]

## 2014-11-22 ENCOUNTER — Telehealth: Payer: Self-pay | Admitting: Internal Medicine

## 2014-11-22 NOTE — Telephone Encounter (Signed)
Please advise 

## 2014-11-22 NOTE — Telephone Encounter (Signed)
Enter Advair info into pre Auth request Sample of Symbicort

## 2014-11-22 NOTE — Telephone Encounter (Signed)
Patient called regarding her budesonide-formoterol (SYMBICORT) 80-4.5 MCG/ACT inhaler [[id number]][[id number]]  Insurance has refused it and the "peer to peer" form was filled out incorrectly. She states the insurance will only approve if she has tried advair and some other medicine prior.  She has tried advair in the past and it should state that on the form. She is completely out of her inhaler and is wondering if she can get a sample for the mean time. She can be reached at [phone]

## 2014-11-23 NOTE — Telephone Encounter (Signed)
Resubmitted PA per PCP request.

## 2014-11-23 NOTE — Telephone Encounter (Signed)
Spoke with pt to inform her she could come pick up Symbicort. She stated she was okay with trying the Advair again. The PA for Symbicort has been resubmitted, if this is denied Advair 100-50 will be sent in.

## 2014-11-25 MED ORDER — FLUTICASONE-SALMETEROL 100-50 MCG/DOSE IN AEPB: 1.0000 | INHALATION_SPRAY | Freq: Two times a day (BID) | RESPIRATORY_TRACT | Status: DC

## 2014-11-25 NOTE — Telephone Encounter (Signed)
Message received that Symbicort was denied again.  Advair sent to pharmacy.

## 2014-12-12 ENCOUNTER — Other Ambulatory Visit: Payer: Self-pay | Admitting: Internal Medicine

## 2014-12-12 NOTE — Telephone Encounter (Signed)
Called refill into pharmacy spoke with Micah NoelLance gave md approval..../lmb

## 2014-12-12 NOTE — Telephone Encounter (Signed)
OK X 1 I am out today

## 2015-04-20 ENCOUNTER — Ambulatory Visit (INDEPENDENT_AMBULATORY_CARE_PROVIDER_SITE_OTHER): Payer: BLUE CROSS/BLUE SHIELD | Admitting: Family Medicine

## 2015-04-20 VITALS — BP 118/76 | HR 76 | Temp 97.9°F | Resp 18 | Ht 68.0 in | Wt 167.8 lb

## 2015-04-20 DIAGNOSIS — J4531 Mild persistent asthma with (acute) exacerbation: Secondary | ICD-10-CM | POA: Diagnosis not present

## 2015-04-20 MED ORDER — ALBUTEROL SULFATE HFA 108 (90 BASE) MCG/ACT IN AERS: 2.0000 | INHALATION_SPRAY | RESPIRATORY_TRACT | Status: DC | PRN

## 2015-04-20 MED ORDER — AZITHROMYCIN 250 MG PO TABS: ORAL_TABLET | ORAL | Status: DC

## 2015-04-20 NOTE — Progress Notes (Signed)
Patient ID: Jennifer Stephens MRN: [id number], DOB: 09-21-1970, 45 y.o. Date of Encounter: 04/20/2015, 5:12 PM  By signing my name below, I, Javier Dockerobert Ryan Halas, attest that this documentation has been prepared under the direction and in the presence of Elvina SidleKurt Lauenstein, MD. Electronically Signed: Javier Dockerobert Ryan Halas, ER Scribe. 04/20/2015. 5:18 PM.  Primary Physician: Marga MelnickWilliam Hopper, MD  Chief Complaint  Patient presents with   URI    x6 days    HPI: 45 y.o. year old female with hx of asthma who presents with rhinorrhea, sore throat, and myalgias for the last week. She had a temperature of 99.5 three days ago. She denies SOB. She has some pain in her lungs when she breaths deeply. She has a past hx of pneumonia. She also needs a refill of her albuterol.  She works as a Teacher, adult educationmassage therapist.  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 Authorizing Provider  albuterol (PROVENTIL HFA;VENTOLIN HFA) 108 (90 BASE) MCG/ACT inhaler Inhale 2 puffs into the lungs every 4 (four) hours as needed. 1-2 puffs every 4 hrs prn 09/16/11  Yes Pecola LawlessWilliam F Hopper, MD  Aspirin-Acetaminophen-Caffeine [phone] MG TABS Take 1 Package by mouth 2 (two) times daily as needed (headahe/pain).   Yes Historical Provider, MD  Fluticasone-Salmeterol (ADVAIR) 100-50 MCG/DOSE AEPB Inhale 1 puff into the lungs 2 (two) times daily. 11/25/14  Yes Pecola LawlessWilliam F Hopper, MD  ibuprofen (ADVIL,MOTRIN) 200 MG tablet Take 400 mg by mouth every 6 (six) hours as needed for pain.   Yes Historical Provider, MD  LORazepam (ATIVAN) 0.5 MG tablet TAKE 1 TABLET (0.5MG  TOTAL) BY MOUTH EVERY 8 HOURS AS NEEDED FOR ANXIETY 12/12/14  Yes Pecola LawlessWilliam F Hopper, MD  clonazePAM (KLONOPIN) 0.5 MG tablet  Take 1 tablet (0.5 mg total) by mouth 2 (two) times daily as needed for anxiety. Patient not taking: Reported on 04/20/2015 01/30/14   Pecola LawlessWilliam F Hopper, MD  DULoxetine (CYMBALTA) 30 MG capsule Take 1 capsule (30 mg total) by mouth daily. Patient not taking: Reported on 04/20/2015 08/31/14   Pecola LawlessWilliam F Hopper, MD    Allergies: No Known Allergies  Social History   Social History   Marital Status: Married    Spouse Name: N/A   Number of Children: N/A   Years of Education: N/A   Occupational History   Not on file.   Social History Main Topics   Smoking status: Former Smoker    Quit date: 01/21/1996   Smokeless tobacco: Never Used     Comment: age 27-28 , up to 1 ppd   Alcohol Use: Yes     Comment: 10 wine & beer / week   Drug Use: No   Sexual Activity: Not on file   Other Topics Concern   Not on file   Social History Narrative     Review of Systems: Constitutional: negative for chills, fever, night sweats, weight changes, or fatigue  HEENT: negative for vision changes, hearing loss, congestion, rhinorrhea, ST, epistaxis, or sinus pressure Cardiovascular: negative for chest pain or palpitations Respiratory: negative for hemoptysis, wheezing, shortness of breath, or cough Abdominal: negative for abdominal pain, nausea, vomiting, diarrhea, or  constipation Dermatological: negative for rash Neurologic: negative for headache, dizziness, or syncope All other systems reviewed and are otherwise negative with the exception to those above and in the HPI.   Physical Exam: Blood pressure 118/76, pulse 76, temperature 97.9 F (36.6 C), temperature source Oral, resp. rate 18, height  (1.727 m), weight 167 lb 12.8 oz (76.114 kg), last menstrual period 04/19/2015, SpO2 98 %., Body mass index is 25.52 kg/(m^2). General: Well developed, well nourished, in no acute distress. Head: Normocephalic, atraumatic, eyes without discharge, sclera non-icteric, nares are without discharge.  Bilateral auditory canals clear, TM's are without perforation, pearly grey and translucent with reflective cone of light bilaterally. Oral cavity moist, posterior pharynx without exudate, erythema, peritonsillar abscess, or post nasal drip.  Neck: Supple. No thyromegaly. Full ROM. No lymphadenopathy. Lungs: Clear bilaterally to auscultation. Breathing is unlabored. Very faint wheezes, rales on the right side. Sonorous rhonchi on the right. Good breath sounds bilaterally Heart: RRR with S1 S2. No murmurs, rubs, or gallops appreciated. Abdomen: Soft, non-tender, non-distended with normoactive bowel sounds. No hepatomegaly. No rebound/guarding. No obvious abdominal masses. Msk:  Strength and tone normal for age. Extremities/Skin: Warm and dry. No clubbing or cyanosis. No edema. No rashes or suspicious lesions. Neuro: Alert and oriented X 3. Moves all extremities spontaneously. Gait is normal. CNII-XII grossly in tact. Psych:  Responds to questions appropriately with a normal affect.      ASSESSMENT AND PLAN:  45 y.o. year old female with Asthma, mild persistent, with acute exacerbation - Plan: azithromycin (ZITHROMAX) 250 MG tablet, albuterol (PROVENTIL HFA;VENTOLIN HFA) 108 (90 Base) MCG/ACT inhaler  This chart was scribed in my presence and reviewed by me personally.    Signed, Elvina Sidle, MD 04/20/2015 5:12 PM

## 2015-04-20 NOTE — Patient Instructions (Addendum)

## 2015-04-26 ENCOUNTER — Telehealth: Payer: Self-pay

## 2015-04-26 NOTE — Telephone Encounter (Signed)
A user error has taken place.

## 2015-05-02 ENCOUNTER — Telehealth: Payer: Self-pay

## 2015-05-02 NOTE — Telephone Encounter (Signed)
i have recd refill request for ativan 0.5mg  tab from [facility] on [address]----patient used to see dr hopper---looks like she has seen another MD recently---advised patient to call back to let me know if this new MD is her new pcp----if she is seeing someone else, patient will need to request this refill from that new pcp---if she wants to continue coming to [place] office, she needs to establish/make appt with pcp here---let Willadean Guyton know so that ativan request can be routed to appropriate pcp

## 2015-05-10 ENCOUNTER — Other Ambulatory Visit: Payer: Self-pay | Admitting: Obstetrics and Gynecology

## 2015-05-14 LAB — CYTOLOGY - PAP

## 2015-06-26 ENCOUNTER — Other Ambulatory Visit: Payer: Self-pay | Admitting: Emergency Medicine

## 2015-06-26 DIAGNOSIS — J4531 Mild persistent asthma with (acute) exacerbation: Secondary | ICD-10-CM

## 2015-06-26 MED ORDER — ALBUTEROL SULFATE HFA 108 (90 BASE) MCG/ACT IN AERS: 1.0000 | INHALATION_SPRAY | RESPIRATORY_TRACT | Status: DC | PRN

## 2015-07-16 ENCOUNTER — Encounter: Payer: Self-pay | Admitting: Internal Medicine

## 2015-07-16 ENCOUNTER — Ambulatory Visit (INDEPENDENT_AMBULATORY_CARE_PROVIDER_SITE_OTHER): Payer: BLUE CROSS/BLUE SHIELD | Admitting: Internal Medicine

## 2015-07-16 VITALS — BP 140/102 | HR 82 | Temp 98.2°F | Resp 16 | Wt 166.0 lb

## 2015-07-16 DIAGNOSIS — F419 Anxiety disorder, unspecified: Secondary | ICD-10-CM | POA: Diagnosis not present

## 2015-07-16 DIAGNOSIS — R030 Elevated blood-pressure reading, without diagnosis of hypertension: Secondary | ICD-10-CM | POA: Diagnosis not present

## 2015-07-16 DIAGNOSIS — Z0000 Encounter for general adult medical examination without abnormal findings: Secondary | ICD-10-CM | POA: Diagnosis not present

## 2015-07-16 DIAGNOSIS — I471 Supraventricular tachycardia: Secondary | ICD-10-CM

## 2015-07-16 DIAGNOSIS — IMO0001 Reserved for inherently not codable concepts without codable children: Secondary | ICD-10-CM

## 2015-07-16 DIAGNOSIS — J4520 Mild intermittent asthma, uncomplicated: Secondary | ICD-10-CM

## 2015-07-16 MED ORDER — BUDESONIDE-FORMOTEROL FUMARATE 80-4.5 MCG/ACT IN AERO: INHALATION_SPRAY | RESPIRATORY_TRACT | Status: DC

## 2015-07-16 MED ORDER — LORAZEPAM 0.5 MG PO TABS: ORAL_TABLET | ORAL | Status: DC

## 2015-07-16 NOTE — Assessment & Plan Note (Addendum)
Was on prn cardizem, but never used it Last episode 3 months ago, infrequent Holds breath and it gets back into rhythm  Follows with cardiology annually No treatment seems necessary at this time

## 2015-07-16 NOTE — Progress Notes (Signed)
Pre visit review using our clinic review tool, if applicable. No additional management support is needed unless otherwise documented below in the visit note. 

## 2015-07-16 NOTE — Assessment & Plan Note (Signed)
Blood pressure elevated here today and on occasion it has been elevated in the past Discussed potential consequences of uncontrolled blood pressure She feels her blood pressure is always well controlled She will start monitoring her blood pressure-: Less than 140/90 given Check CMP, TSH

## 2015-07-16 NOTE — Patient Instructions (Addendum)
Monitor your blood pressure - it should be less than 140/90.  Work on decreasing your alcohol intake.   Test(s) ordered today. Your results will be released to [place] (or called to you) after review, usually within 72hours after test completion. If any changes need to be made, you will be notified at that same time.  All other Health Maintenance issues reviewed.   All recommended immunizations and age-appropriate screenings are up-to-date or discussed.  No immunizations administered today.   Medications reviewed and updated.  Changes include restarting symbicort after approval from your insurance - a prescription was sent to your pharmacy.   Your prescription(s) have been submitted to your pharmacy. Please take as directed and contact our office if you believe you are having problem(s) with the medication(s).   Please followup in one year, sooner if needed  Health Maintenance, Female Adopting a healthy lifestyle and getting preventive care can go a long way to promote health and wellness. Talk with your health care provider about what schedule of regular examinations is right for you. This is a good chance for you to check in with your provider about disease prevention and staying healthy. In between checkups, there are plenty of things you can do on your own. Experts have done a lot of research about which lifestyle changes and preventive measures are most likely to keep you healthy. Ask your health care provider for more information. WEIGHT AND DIET  Eat a healthy diet  Be sure to include plenty of vegetables, fruits, low-fat dairy products, and lean protein.  Do not eat a lot of foods high in solid fats, added sugars, or salt.  Get regular exercise. This is one of the most important things you can do for your health.  Most adults should exercise for at least 150 minutes each week. The exercise should increase your heart rate and make you sweat (moderate-intensity exercise).  Most  adults should also do strengthening exercises at least twice a week. This is in addition to the moderate-intensity exercise.  Maintain a healthy weight  Body mass index (BMI) is a measurement that can be used to identify possible weight problems. It estimates body fat based on height and weight. Your health care provider can help determine your BMI and help you achieve or maintain a healthy weight.  For females 92 years of age and older:   A BMI below 18.5 is considered underweight.  A BMI of 18.5 to 24.9 is normal.  A BMI of 25 to 29.9 is considered overweight.  A BMI of 30 and above is considered obese.  Watch levels of cholesterol and blood lipids  You should start having your blood tested for lipids and cholesterol at 47 years of age, then have this test every 5 years.  You may need to have your cholesterol levels checked more often if:  Your lipid or cholesterol levels are high.  You are older than 47 years of age.  You are at high risk for heart disease.  CANCER SCREENING   Lung Cancer  Lung cancer screening is recommended for adults 22-49 years old who are at high risk for lung cancer because of a history of smoking.  A yearly low-dose CT scan of the lungs is recommended for people who:  Currently smoke.  Have quit within the past 15 years.  Have at least a 30-pack-year history of smoking. A pack year is smoking an average of one pack of cigarettes a day for 1 year.  Yearly screening  should continue until it has been 15 years since you quit.  Yearly screening should stop if you develop a health problem that would prevent you from having lung cancer treatment.  Breast Cancer  Practice breast self-awareness. This means understanding how your breasts normally appear and feel.  It also means doing regular breast self-exams. Let your health care provider know about any changes, no matter how small.  If you are in your 20s or 30s, you should have a clinical  breast exam (CBE) by a health care provider every 1-3 years as part of a regular health exam.  If you are 47 or older, have a CBE every year. Also consider having a breast X-ray (mammogram) every year.  If you have a family history of breast cancer, talk to your health care provider about genetic screening.  If you are at high risk for breast cancer, talk to your health care provider about having an MRI and a mammogram every year.  Breast cancer gene (BRCA) assessment is recommended for women who have family members with BRCA-related cancers. BRCA-related cancers include:  Breast.  Ovarian.  Tubal.  Peritoneal cancers.  Results of the assessment will determine the need for genetic counseling and BRCA1 and BRCA2 testing. Cervical Cancer Your health care provider may recommend that you be screened regularly for cancer of the pelvic organs (ovaries, uterus, and vagina). This screening involves a pelvic examination, including checking for microscopic changes to the surface of your cervix (Pap test). You may be encouraged to have this screening done every 3 years, beginning at age 70.  For women ages 4-65, health care providers may recommend pelvic exams and Pap testing every 3 years, or they may recommend the Pap and pelvic exam, combined with testing for human papilloma virus (HPV), every 5 years. Some types of HPV increase your risk of cervical cancer. Testing for HPV may also be done on women of any age with unclear Pap test results.  Other health care providers may not recommend any screening for nonpregnant women who are considered low risk for pelvic cancer and who do not have symptoms. Ask your health care provider if a screening pelvic exam is right for you.  If you have had past treatment for cervical cancer or a condition that could lead to cancer, you need Pap tests and screening for cancer for at least 20 years after your treatment. If Pap tests have been discontinued, your risk  factors (such as having a new sexual partner) need to be reassessed to determine if screening should resume. Some women have medical problems that increase the chance of getting cervical cancer. In these cases, your health care provider may recommend more frequent screening and Pap tests. Colorectal Cancer  This type of cancer can be detected and often prevented.  Routine colorectal cancer screening usually begins at 47 years of age and continues through 47 years of age.  Your health care provider may recommend screening at an earlier age if you have risk factors for colon cancer.  Your health care provider may also recommend using home test kits to check for hidden blood in the stool.  A small camera at the end of a tube can be used to examine your colon directly (sigmoidoscopy or colonoscopy). This is done to check for the earliest forms of colorectal cancer.  Routine screening usually begins at age 64.  Direct examination of the colon should be repeated every 5-10 years through 47 years of age. However, you may need  to be screened more often if early forms of precancerous polyps or small growths are found. Skin Cancer  Check your skin from head to toe regularly.  Tell your health care provider about any new moles or changes in moles, especially if there is a change in a mole's shape or color.  Also tell your health care provider if you have a mole that is larger than the size of a pencil eraser.  Always use sunscreen. Apply sunscreen liberally and repeatedly throughout the day.  Protect yourself by wearing long sleeves, pants, a wide-brimmed hat, and sunglasses whenever you are outside. HEART DISEASE, DIABETES, AND HIGH BLOOD PRESSURE   High blood pressure causes heart disease and increases the risk of stroke. High blood pressure is more likely to develop in:  People who have blood pressure in the high end of the normal range (130-139/85-89 mm Hg).  People who are overweight or  obese.  People who are African American.  If you are 68-38 years of age, have your blood pressure checked every 3-5 years. If you are 22 years of age or older, have your blood pressure checked every year. You should have your blood pressure measured twice--once when you are at a hospital or clinic, and once when you are not at a hospital or clinic. Record the average of the two measurements. To check your blood pressure when you are not at a hospital or clinic, you can use:  An automated blood pressure machine at a pharmacy.  A home blood pressure monitor.  If you are between 63 years and 72 years old, ask your health care provider if you should take aspirin to prevent strokes.  Have regular diabetes screenings. This involves taking a blood sample to check your fasting blood sugar level.  If you are at a normal weight and have a low risk for diabetes, have this test once every three years after 47 years of age.  If you are overweight and have a high risk for diabetes, consider being tested at a younger age or more often. PREVENTING INFECTION  Hepatitis B  If you have a higher risk for hepatitis B, you should be screened for this virus. You are considered at high risk for hepatitis B if:  You were born in a country where hepatitis B is common. Ask your health care provider which countries are considered high risk.  Your parents were born in a high-risk country, and you have not been immunized against hepatitis B (hepatitis B vaccine).  You have HIV or AIDS.  You use needles to inject street drugs.  You live with someone who has hepatitis B.  You have had sex with someone who has hepatitis B.  You get hemodialysis treatment.  You take certain medicines for conditions, including cancer, organ transplantation, and autoimmune conditions. Hepatitis C  Blood testing is recommended for:  Everyone born from [birth year] through 1967.  Anyone with known risk factors for hepatitis  C. Sexually transmitted infections (STIs)  You should be screened for sexually transmitted infections (STIs) including gonorrhea and chlamydia if:  You are sexually active and are younger than 47 years of age.  You are older than 47 years of age and your health care provider tells you that you are at risk for this type of infection.  Your sexual activity has changed since you were last screened and you are at an increased risk for chlamydia or gonorrhea. Ask your health care provider if you are at risk.  If  you do not have HIV, but are at risk, it may be recommended that you take a prescription medicine daily to prevent HIV infection. This is called pre-exposure prophylaxis (PrEP). You are considered at risk if:  You are sexually active and do not regularly use condoms or know the HIV status of your partner(s).  You take drugs by injection.  You are sexually active with a partner who has HIV. Talk with your health care provider about whether you are at high risk of being infected with HIV. If you choose to begin PrEP, you should first be tested for HIV. You should then be tested every 3 months for as long as you are taking PrEP.  PREGNANCY   If you are premenopausal and you may become pregnant, ask your health care provider about preconception counseling.  If you may become pregnant, take 400 to 800 micrograms (mcg) of folic acid every day.  If you want to prevent pregnancy, talk to your health care provider about birth control (contraception). OSTEOPOROSIS AND MENOPAUSE   Osteoporosis is a disease in which the bones lose minerals and strength with aging. This can result in serious bone fractures. Your risk for osteoporosis can be identified using a bone density scan.  If you are 42 years of age or older, or if you are at risk for osteoporosis and fractures, ask your health care provider if you should be screened.  Ask your health care provider whether you should take a calcium or  vitamin D supplement to lower your risk for osteoporosis.  Menopause may have certain physical symptoms and risks.  Hormone replacement therapy may reduce some of these symptoms and risks. Talk to your health care provider about whether hormone replacement therapy is right for you.  HOME CARE INSTRUCTIONS   Schedule regular health, dental, and eye exams.  Stay current with your immunizations.   Do not use any tobacco products including cigarettes, chewing tobacco, or electronic cigarettes.  If you are pregnant, do not drink alcohol.  If you are breastfeeding, limit how much and how often you drink alcohol.  Limit alcohol intake to no more than 1 drink per day for nonpregnant women. One drink equals 12 ounces of beer, 5 ounces of wine, or 1 ounces of hard liquor.  Do not use street drugs.  Do not share needles.  Ask your health care provider for help if you need support or information about quitting drugs.  Tell your health care provider if you often feel depressed.  Tell your health care provider if you have ever been abused or do not feel safe at home.   This information is not intended to replace advice given to you by your health care provider. Make sure you discuss any questions you have with your health care provider.   Document Released: 07/22/2010 Document Revised: 01/27/2014 Document Reviewed: 12/08/2012 Elsevier Interactive Patient Education [employer].

## 2015-07-16 NOTE — Assessment & Plan Note (Signed)
Uses albuterol as needed Was using Symbicort, which was very effective. Changed to Advair due to insurance coverage and this has not been as effective. She went from using her albuterol inhaler every few months to every other day We'll need to start prior authorization her Symbicort since Advair is not effective Prescription sent to pharmacy for Symbicort-initiate prior authorization

## 2015-07-16 NOTE — Assessment & Plan Note (Signed)
Does not want to take a daily medication-was on Lexapro in the past We will continue Ativan as needed-encouraged her not to take a daily basis. Discussed potential addiction risk and risk of medication not being as effective when taken on a daily basis She does go long periods without taking her medication and will continue to take it only as needed

## 2015-07-16 NOTE — Progress Notes (Signed)
Subjective:    Patient ID: Jennifer Stephens, female    DOB: [birth year], 45 y.o.   MRN: [id number]  HPI She is here to establish with a new pcp.  She is here for a physical exam.   Asthma:  She was on symbicort in the past and it worked well.  She had to go back to advair for insurance purposes and it does not work as well.  She needs to use her albuterol more often since switching - was using it every three months and now is using it every other day.   Anxiety:  She is divorced and a single mom.  She worries and is stressed out.  She was on lexapro and did not want to be on that type of medication.  She has been taking the ativan or prior to that clonazepam.  She takes 1/2 of a ativan daily and for the past two weeks has taken it daily, but prior to that she was not taking it daily.  She feels it prior to her period.  She does not take it for sleep.  She has gone three months without taking the medication in the past.    Her blood pressure is typically well controlled.  She is anxious and has been running around all day.   Medications and allergies reviewed with patient and updated if appropriate.  Patient Active Problem List   Diagnosis Date Noted  . Anxiety 01/23/2014  . Heart palpitations 01/23/2014  . 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 Anemia 05/23/2008  . Mild intermittent asthma 03/15/2007    Current Outpatient Prescriptions on File Prior to Visit  Medication Sig Dispense Refill  . albuterol (PROVENTIL HFA;VENTOLIN HFA) 108 (90 Base) MCG/ACT inhaler Inhale 1-2 puffs into the lungs every 4 (four) hours as needed. 8.5 g 0  . Aspirin-Acetaminophen-Caffeine 260-130-16 MG TABS Take 1 Package by mouth 2 (two) times daily as needed (headahe/pain).    . clonazePAM (KLONOPIN) 0.5 MG tablet Take 1 tablet (0.5 mg total) by mouth 2 (two) times daily as needed for anxiety. 30 tablet 1  . Fluticasone-Salmeterol (ADVAIR) 100-50 MCG/DOSE AEPB  Inhale 1 puff into the lungs 2 (two) times daily. 1 each 3  . ibuprofen (ADVIL,MOTRIN) 200 MG tablet Take 400 mg by mouth every 6 (six) hours as needed for pain.    Marland Kitchen. LORazepam (ATIVAN) 0.5 MG tablet TAKE 1 TABLET (0.5MG  TOTAL) BY MOUTH EVERY 8 HOURS AS NEEDED FOR ANXIETY 30 tablet 0   No current facility-administered medications on file prior to visit.    Past Medical History  Diagnosis Date  . Asthma   . Palpitations 2002    sinus arrythmia on holter  . Eosinophilia   . Anemia     in context blood donor & vegetarian status  . Abnormal Pap smear 12/2011    Dr Tenny Crawoss    Past Surgical History  Procedure Laterality Date  . Wisdom tooth extraction  1994  . Flexible sigmoidoscopy  1999    Negative, Dr Virginia Rochesterrr( done for rectal bleeding)  . Gravida 2 para 2      Dr Tenny Crawoss  . Cervix surgery  12/2011    Social History   Social History  . Marital Status: Married    Spouse Name: N/A  . Number of Children: N/A  . Years of Education: N/A   Social History Main Topics  . Smoking status: Former Smoker    Quit date:  01/21/1996  . Smokeless tobacco: Never Used     Comment: age [age] , up to 1 ppd  . Alcohol Use: Yes     Comment: 10 wine & beer / week  . Drug Use: No  . Sexual Activity: Not Asked   Other Topics Concern  . None   Social History Narrative    Family History  Problem Relation Age of Onset  . Prostate cancer Father   . Heart attack Father 6070  . Emphysema Father   . Coronary artery disease Father     CABG four vessel  . Diabetes Neg Hx   . Hyperlipidemia Neg Hx   . Stroke Neg Hx   . Atrial fibrillation Mother   . Alcoholism Father     Review of Systems  Constitutional: Negative for fever, chills, appetite change, fatigue and unexpected weight change.  Eyes: Negative for visual disturbance.  Respiratory: Positive for wheezing (frequent). Negative for cough and shortness of breath.   Cardiovascular: Positive for palpitations (rare). Negative for chest pain and  leg swelling.  Gastrointestinal: Negative for nausea, abdominal pain, diarrhea, constipation and blood in stool.  Genitourinary: Negative for dysuria and hematuria.  Musculoskeletal: Negative for myalgias, back pain and arthralgias.  Neurological: Negative for dizziness, light-headedness and headaches.  Psychiatric/Behavioral: Positive for dysphoric mood (mild). The patient is nervous/anxious.        Objective:   Filed Vitals:   07/16/15 1520  BP: 140/102  Pulse: 82  Temp: 98.2 F (36.8 C)  Resp: 16   Filed Weights   07/16/15 1520  Weight: 166 lb (75.297 kg)   Body mass index is 25.25 kg/(m^2).   Physical Exam Constitutional: She appears well-developed and well-nourished. No distress.  HENT:  Head: Normocephalic and atraumatic.  Right Ear: External ear normal. Normal ear canal and TM Left Ear: External ear normal.  Normal ear canal and TM Mouth/Throat: Oropharynx is clear and moist.  Eyes: Conjunctivae and EOM are normal.  Neck: Neck supple. No tracheal deviation present. No thyromegaly present.  No carotid bruit  Cardiovascular: Normal rate, regular rhythm and normal heart sounds.   No murmur heard.  No edema. Pulmonary/Chest: Effort normal and breath sounds normal. No respiratory distress. She has no wheezes. She has no rales.  Breast: deferred to Gyn Abdominal: Soft. She exhibits no distension. There is no tenderness.  Lymphadenopathy: She has no cervical adenopathy.  Skin: Skin is warm and dry. She is not diaphoretic.  Psychiatric: She has an anxious mood and affect. Her behavior is normal.       Assessment & Plan:   Physical exam: Screening blood work ordered Immunizations  Up to date Mammogram Up to date per patient Gyn sees annually Exercise - regularly, walking, some yoga Weight - normal BMI Skin - saw derm recently, wears sunscreen Substance abuse - alcohol intake is higher than ideal, discussed decreasing her intake  See Problem List for Assessment  and Plan of chronic medical problems.  Follow-up annually, sooner if needed

## 2015-07-17 ENCOUNTER — Telehealth: Payer: Self-pay

## 2015-07-17 NOTE — Telephone Encounter (Signed)
PA initiated via CoverMyMeds key [id number]

## 2015-07-17 NOTE — Telephone Encounter (Signed)
Response received from CoverMyMeds - No PA required. Paid pharmacy Claim on 07/16/2015

## 2015-07-26 ENCOUNTER — Other Ambulatory Visit: Payer: Self-pay | Admitting: Internal Medicine

## 2015-07-31 ENCOUNTER — Other Ambulatory Visit: Payer: Self-pay | Admitting: *Deleted

## 2015-07-31 NOTE — Telephone Encounter (Signed)
Received fax pt reqesting refill on her Lorazepam 0.5mg . Last filled 01/04/15. Per chart MD refill med on 07/16/15. Called [facility] to verify if they received rx which they did not. Gave verbal order to fill from 6/26...../l;mb

## 2015-08-29 ENCOUNTER — Telehealth: Payer: Self-pay | Admitting: *Deleted

## 2015-08-29 NOTE — Telephone Encounter (Signed)
Rec'd fax pt requesting refill on her Lorazepam 0.5 mg. Last filled 07/31/15.Dr. Lawerance BachBurns is out of the office this week pls advise.....Raechel Chute/lmb

## 2015-08-30 MED ORDER — LORAZEPAM 0.5 MG PO TABS: ORAL_TABLET | ORAL | 0 refills | Status: DC

## 2015-08-30 NOTE — Telephone Encounter (Signed)
Faxed script to [facility]..../lmb 

## 2015-08-30 NOTE — Telephone Encounter (Signed)
Printed and signed.  

## 2015-09-19 ENCOUNTER — Other Ambulatory Visit (INDEPENDENT_AMBULATORY_CARE_PROVIDER_SITE_OTHER): Payer: BLUE CROSS/BLUE SHIELD

## 2015-09-19 DIAGNOSIS — Z0000 Encounter for general adult medical examination without abnormal findings: Secondary | ICD-10-CM | POA: Diagnosis not present

## 2015-09-19 LAB — CBC WITH DIFFERENTIAL/PLATELET
Basophils Absolute: 0.1 10*3/uL (ref 0.0–0.1)
Basophils Relative: 1 % (ref 0.0–3.0)
EOS ABS: 0.7 10*3/uL (ref 0.0–0.7)
EOS PCT: 14.4 % — AB (ref 0.0–5.0)
HCT: 39.9 % (ref 36.0–46.0)
HEMOGLOBIN: 13.3 g/dL (ref 12.0–15.0)
LYMPHS ABS: 1.4 10*3/uL (ref 0.7–4.0)
Lymphocytes Relative: 26.3 % (ref 12.0–46.0)
MCHC: 33.3 g/dL (ref 30.0–36.0)
MCV: 94.7 fl (ref 78.0–100.0)
MONO ABS: 0.7 10*3/uL (ref 0.1–1.0)
Monocytes Relative: 12.8 % — ABNORMAL HIGH (ref 3.0–12.0)
NEUTROS PCT: 45.5 % (ref 43.0–77.0)
Neutro Abs: 2.4 10*3/uL (ref 1.4–7.7)
Platelets: 176 10*3/uL (ref 150.0–400.0)
RBC: 4.22 Mil/uL (ref 3.87–5.11)
RDW: 14.6 % (ref 11.5–15.5)
WBC: 5.2 10*3/uL (ref 4.0–10.5)

## 2015-09-19 LAB — COMPREHENSIVE METABOLIC PANEL
ALBUMIN: 4.1 g/dL (ref 3.5–5.2)
ALT: 57 U/L — ABNORMAL HIGH (ref 0–35)
AST: 65 U/L — AB (ref 0–37)
Alkaline Phosphatase: 35 U/L — ABNORMAL LOW (ref 39–117)
BUN: 6 mg/dL (ref 6–23)
CHLORIDE: 106 meq/L (ref 96–112)
CO2: 25 mEq/L (ref 19–32)
CREATININE: 0.68 mg/dL (ref 0.40–1.20)
Calcium: 8.7 mg/dL (ref 8.4–10.5)
GFR: 98.43 mL/min (ref >60.00)
GLUCOSE: 77 mg/dL (ref 70–99)
POTASSIUM: 4.1 meq/L (ref 3.5–5.1)
SODIUM: 140 meq/L (ref 135–145)
TOTAL PROTEIN: 7.2 g/dL (ref 6.0–8.3)
Total Bilirubin: 0.3 mg/dL (ref 0.2–1.2)

## 2015-09-19 LAB — TSH: TSH: 2.55 u[IU]/mL (ref 0.35–4.50)

## 2015-09-19 LAB — LIPID PANEL
Cholesterol: 257 mg/dL — ABNORMAL HIGH (ref 0–200)
HDL: 120.5 mg/dL (ref >39.00)
LDL CALC: 106 mg/dL — AB (ref 0–99)
NONHDL: 136.82
Total CHOL/HDL Ratio: 2
Triglycerides: 155 mg/dL — ABNORMAL HIGH (ref 0.0–149.0)
VLDL: 31 mg/dL (ref 0.0–40.0)

## 2015-09-21 ENCOUNTER — Encounter: Payer: Self-pay | Admitting: Internal Medicine

## 2016-01-17 ENCOUNTER — Other Ambulatory Visit: Payer: Self-pay | Admitting: Internal Medicine

## 2016-01-18 NOTE — Telephone Encounter (Signed)
RX faxed to [facility] 

## 2016-01-18 NOTE — Telephone Encounter (Signed)
[place] controlled substance database checked.  Ok to fill medication. printed 

## 2016-02-15 ENCOUNTER — Other Ambulatory Visit: Payer: Self-pay | Admitting: Internal Medicine

## 2016-02-18 NOTE — Telephone Encounter (Signed)
RX faxed to [facility] 

## 2016-03-18 ENCOUNTER — Other Ambulatory Visit: Payer: Self-pay | Admitting: Internal Medicine

## 2016-03-19 NOTE — Telephone Encounter (Signed)
[place] controlled substance database checked.  Ok to fill medication.   Make sure she has a yearly follow up scheduled. please

## 2016-03-19 NOTE — Telephone Encounter (Signed)
Called refill into [facility] & called pt no answer LMOM to make CPX for future refills...Raechel Chute/lmb

## 2016-04-15 ENCOUNTER — Other Ambulatory Visit: Payer: Self-pay | Admitting: Internal Medicine

## 2016-05-12 LAB — HM MAMMOGRAPHY

## 2016-05-14 ENCOUNTER — Other Ambulatory Visit: Payer: Self-pay | Admitting: Obstetrics and Gynecology

## 2016-05-14 DIAGNOSIS — R928 Other abnormal and inconclusive findings on diagnostic imaging of breast: Secondary | ICD-10-CM

## 2016-05-15 DIAGNOSIS — R928 Other abnormal and inconclusive findings on diagnostic imaging of breast: Secondary | ICD-10-CM | POA: Insufficient documentation

## 2016-05-20 ENCOUNTER — Ambulatory Visit
Admission: RE | Admit: 2016-05-20 | Discharge: 2016-05-20 | Disposition: A | Discharge status: Home / Self Care | Payer: BLUE CROSS/BLUE SHIELD | Source: Ambulatory Visit | Attending: Obstetrics and Gynecology | Admitting: Obstetrics and Gynecology

## 2016-05-20 DIAGNOSIS — R928 Other abnormal and inconclusive findings on diagnostic imaging of breast: Secondary | ICD-10-CM

## 2016-05-29 ENCOUNTER — Encounter: Payer: Self-pay | Admitting: Internal Medicine

## 2016-07-21 NOTE — Progress Notes (Signed)
Subjective:    Patient ID: Jennifer Stephens, female    DOB: 1970-06-29, 46 y.o.   MRN: [id number]  HPI She is here for a physical exam.   BP usually on low side when she gives blood and she does that regularly.  It is often higher here in the office.   She walks at work about 2 miles a day at work.    Medications and allergies reviewed with patient and updated if appropriate.  Patient Active Problem List   Diagnosis Date Noted  . Palpitations 07/22/2016  . Abnormal mammogram 05/15/2016  . Elevated blood pressure reading 07/16/2015  . Anxiety 01/23/2014  . 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 Atypical squamous cells of undetermined significance on cytologic smear of cervix (ASC-US) 01/02/2012  . 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 Anemia 05/23/2008  . Mild intermittent asthma 03/15/2007    Current Outpatient Prescriptions on File Prior to Visit  Medication Sig Dispense Refill  . budesonide-formoterol (SYMBICORT) 80-4.5 MCG/ACT inhaler INHALE ONE(1) TO TWO(2) PUFFS INTO LUNGS EVERY 12 HOURS (SPIT AND GARGLE AFTER USE) 10.2 g 11  . LORazepam (ATIVAN) 0.5 MG tablet TAKE ONE TABLET BY MOUTH EVERY 8 HOURS AS NEEDED FOR ANXIETY 30 tablet 0  . PROAIR HFA 108 (90 Base) MCG/ACT inhaler INHALE 1-2 PUFFS INTO THE LUNGS EVERY 4 HOURS AS NEEDED. 8.5 each 2   No current facility-administered medications on file prior to visit.     Past Medical History:  Diagnosis Date  . Abnormal Pap smear 12/2011   Dr Tenny Crawoss  . Anemia    in context blood donor & vegetarian status  . Asthma   . Eosinophilia   . 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 CERVIX SURGERY  12/2011  . FLEXIBLE SIGMOIDOSCOPY  1999   Negative, Dr Virginia Rochesterrr( done for rectal bleeding)  . Gravida 2 Para 2     Dr Tenny Crawoss  . WISDOM TOOTH EXTRACTION  1994    Social History   Social History  . Marital status: Married    Spouse name: N/A  . Number of  children: N/A  . Years of education: N/A   Social History Main Topics  . Smoking status: Former Smoker    Quit date: 01/21/1996  . Smokeless tobacco: Never Used     Comment: age [age] , up to 1 ppd  . Alcohol use Yes     Comment: 1-2 per day - glasses of wine  . Drug use: No  . Sexual activity: Not Asked   Other Topics Concern  . None   Social History Narrative   Divorced, two kids      Exercise - walks the dogs, does yoga at home    Family History  Problem Relation Age of Onset  . Prostate cancer Father   . Heart attack Father 4670  . Emphysema Father   . Coronary artery disease Father        CABG four vessel  . Alcoholism Father   . Atrial fibrillation Mother   . Diabetes Neg Hx   . Hyperlipidemia Neg Hx   . Stroke Neg Hx     Review of Systems  Constitutional: Positive for fatigue. Negative for appetite change, chills and fever.  Eyes: Negative for visual disturbance.  Respiratory: Negative for cough, shortness of breath and wheezing.   Cardiovascular: Positive for palpitations (occasional). Negative for chest pain and leg swelling.  Gastrointestinal: Negative for abdominal pain, blood in stool, constipation, diarrhea and nausea.       No gerd  Genitourinary: Negative for dysuria and hematuria.  Musculoskeletal: Negative for arthralgias and back pain.  Skin: Negative for color change and rash.  Neurological: Negative for dizziness, light-headedness and headaches.  Psychiatric/Behavioral: Positive for dysphoric mood (occasional). The patient is nervous/anxious.        Objective:   Vitals:   07/22/16 0804  BP: (!) 140/92  Pulse: 66  Resp: 16  Temp: 98.1 F (36.7 C)   Filed Weights   07/22/16 0804  Weight: 162 lb (73.5 kg)   Body mass index is 24.63 kg/m.  Wt Readings from Last 3 Encounters:  07/22/16 162 lb (73.5 kg)  07/16/15 166 lb (75.3 kg)  04/20/15 167 lb 12.8 oz (76.1 kg)     Physical Exam Constitutional: She appears well-developed and  well-nourished. No distress.  HENT:  Head: Normocephalic and atraumatic.  Right Ear: External ear normal. Normal ear canal and TM Left Ear: External ear normal.  Normal ear canal and TM Mouth/Throat: Oropharynx is clear and moist.  Eyes: Conjunctivae and EOM are normal.  Neck: Neck supple. No tracheal deviation present. No thyromegaly present.  No carotid bruit  Cardiovascular: Normal rate, regular rhythm and normal heart sounds.   No murmur heard.  No edema. Pulmonary/Chest: Effort normal and breath sounds normal. No respiratory distress. She has no wheezes. She has no rales.  Breast: deferred to Gyn Abdominal: Soft. She exhibits no distension. There is no tenderness.  Lymphadenopathy: She has no cervical adenopathy.  Skin: Skin is warm and dry. She is not diaphoretic.  Psychiatric: She has a normal mood and affect. Her behavior is normal.         Assessment & Plan:   Physical exam: Screening blood work  ordered Immunizations   Up to date   Mammogram  Up to date  Gyn   Up to date  Exercise  Very active - walks a lot and does stairs regularly at work   Weight  Normal BMI Skin   No concerns Substance abuse  - drinks 1-2 glasses of wine a day -- advised that she should not drink more than 1  See Problem List for Assessment and Plan of chronic medical problems.  FU annually

## 2016-07-21 NOTE — Patient Instructions (Addendum)
Test(s) ordered today. Your results will be released to MyChart (or called to you) after review, usually within 72hours after test completion. If any changes need to be made, you will be notified at that same time.  All other Health Maintenance issues reviewed.   All recommended immunizations and age-appropriate screenings are up-to-date or discussed.  No immunizations administered today.   Medications reviewed and updated.  No changes recommended at this time.   Please followup in one year for a physical   Health Maintenance, Female Adopting a healthy lifestyle and getting preventive care can go a long way to promote health and wellness. Talk with your health care provider about what schedule of regular examinations is right for you. This is a good chance for you to check in with your provider about disease prevention and staying healthy. In between checkups, there are plenty of things you can do on your own. Experts have done a lot of research about which lifestyle changes and preventive measures are most likely to keep you healthy. Ask your health care provider for more information. Weight and diet Eat a healthy diet  Be sure to include plenty of vegetables, fruits, low-fat dairy products, and lean protein.  Do not eat a lot of foods high in solid fats, added sugars, or salt.  Get regular exercise. This is one of the most important things you can do for your health. ? Most adults should exercise for at least 150 minutes each week. The exercise should increase your heart rate and make you sweat (moderate-intensity exercise). ? Most adults should also do strengthening exercises at least twice a week. This is in addition to the moderate-intensity exercise.  Maintain a healthy weight  Body mass index (BMI) is a measurement that can be used to identify possible weight problems. It estimates body fat based on height and weight. Your health care provider can help determine your BMI and help  you achieve or maintain a healthy weight.  For females 20 years of age and older: ? A BMI below 18.5 is considered underweight. ? A BMI of 18.5 to 24.9 is normal. ? A BMI of 25 to 29.9 is considered overweight. ? A BMI of 30 and above is considered obese.  Watch levels of cholesterol and blood lipids  You should start having your blood tested for lipids and cholesterol at 48 years of age, then have this test every 5 years.  You may need to have your cholesterol levels checked more often if: ? Your lipid or cholesterol levels are high. ? You are older than 48 years of age. ? You are at high risk for heart disease.  Cancer screening Lung Cancer  Lung cancer screening is recommended for adults 55-80 years old who are at high risk for lung cancer because of a history of smoking.  A yearly low-dose CT scan of the lungs is recommended for people who: ? Currently smoke. ? Have quit within the past 15 years. ? Have at least a 30-pack-year history of smoking. A pack year is smoking an average of one pack of cigarettes a day for 1 year.  Yearly screening should continue until it has been 15 years since you quit.  Yearly screening should stop if you develop a health problem that would prevent you from having lung cancer treatment.  Breast Cancer  Practice breast self-awareness. This means understanding how your breasts normally appear and feel.  It also means doing regular breast self-exams. Let your health care provider know   know about any changes, no matter how small.  If you are in your 20s or 30s, you should have a clinical breast exam (CBE) by a health care provider every 1-3 years as part of a regular health exam.  If you are 52 or older, have a CBE every year. Also consider having a breast X-ray (mammogram) every year.  If you have a family history of breast cancer, talk to your health care provider about genetic screening.  If you are at high risk for breast cancer, talk to  your health care provider about having an MRI and a mammogram every year.  Breast cancer gene (BRCA) assessment is recommended for women who have family members with BRCA-related cancers. BRCA-related cancers include: ? Breast. ? Ovarian. ? Tubal. ? Peritoneal cancers.  Results of the assessment will determine the need for genetic counseling and BRCA1 and BRCA2 testing.  Cervical Cancer Your health care provider may recommend that you be screened regularly for cancer of the pelvic organs (ovaries, uterus, and vagina). This screening involves a pelvic examination, including checking for microscopic changes to the surface of your cervix (Pap test). You may be encouraged to have this screening done every 3 years, beginning at age 42.  For women ages 71-65, health care providers may recommend pelvic exams and Pap testing every 3 years, or they may recommend the Pap and pelvic exam, combined with testing for human papilloma virus (HPV), every 5 years. Some types of HPV increase your risk of cervical cancer. Testing for HPV may also be done on women of any age with unclear Pap test results.  Other health care providers may not recommend any screening for nonpregnant women who are considered low risk for pelvic cancer and who do not have symptoms. Ask your health care provider if a screening pelvic exam is right for you.  If you have had past treatment for cervical cancer or a condition that could lead to cancer, you need Pap tests and screening for cancer for at least 20 years after your treatment. If Pap tests have been discontinued, your risk factors (such as having a new sexual partner) need to be reassessed to determine if screening should resume. Some women have medical problems that increase the chance of getting cervical cancer. In these cases, your health care provider may recommend more frequent screening and Pap tests.  Colorectal Cancer  This type of cancer can be detected and often  prevented.  Routine colorectal cancer screening usually begins at 47 years of age and continues through 48 years of age.  Your health care provider may recommend screening at an earlier age if you have risk factors for colon cancer.  Your health care provider may also recommend using home test kits to check for hidden blood in the stool.  A small camera at the end of a tube can be used to examine your colon directly (sigmoidoscopy or colonoscopy). This is done to check for the earliest forms of colorectal cancer.  Routine screening usually begins at age 19.  Direct examination of the colon should be repeated every 5-10 years through 48 years of age. However, you may need to be screened more often if early forms of precancerous polyps or small growths are found.  Skin Cancer  Check your skin from head to toe regularly.  Tell your health care provider about any new moles or changes in moles, especially if there is a change in a mole's shape or color.  Also tell your health  care provider if you have a mole that is larger than the size of a pencil eraser.  Always use sunscreen. Apply sunscreen liberally and repeatedly throughout the day.  Protect yourself by wearing long sleeves, pants, a wide-brimmed hat, and sunglasses whenever you are outside.  Heart disease, diabetes, and high blood pressure  High blood pressure causes heart disease and increases the risk of stroke. High blood pressure is more likely to develop in: ? People who have blood pressure in the high end of the normal range (130-139/85-89 mm Hg). ? People who are overweight or obese. ? People who are African American.  If you are 18-39 years of age, have your blood pressure checked every 3-5 years. If you are 40 years of age or older, have your blood pressure checked every year. You should have your blood pressure measured twice-once when you are at a hospital or clinic, and once when you are not at a hospital or clinic.  Record the average of the two measurements. To check your blood pressure when you are not at a hospital or clinic, you can use: ? An automated blood pressure machine at a pharmacy. ? A home blood pressure monitor.  If you are between 55 years and 79 years old, ask your health care provider if you should take aspirin to prevent strokes.  Have regular diabetes screenings. This involves taking a blood sample to check your fasting blood sugar level. ? If you are at a normal weight and have a low risk for diabetes, have this test once every three years after 48 years of age. ? If you are overweight and have a high risk for diabetes, consider being tested at a younger age or more often. Preventing infection Hepatitis B  If you have a higher risk for hepatitis B, you should be screened for this virus. You are considered at high risk for hepatitis B if: ? You were born in a country where hepatitis B is common. Ask your health care provider which countries are considered high risk. ? Your parents were born in a high-risk country, and you have not been immunized against hepatitis B (hepatitis B vaccine). ? You have HIV or AIDS. ? You use needles to inject street drugs. ? You live with someone who has hepatitis B. ? You have had sex with someone who has hepatitis B. ? You get hemodialysis treatment. ? You take certain medicines for conditions, including cancer, organ transplantation, and autoimmune conditions.  Hepatitis C  Blood testing is recommended for: ? Everyone born from 1945 through 1965. ? Anyone with known risk factors for hepatitis C.  Sexually transmitted infections (STIs)  You should be screened for sexually transmitted infections (STIs) including gonorrhea and chlamydia if: ? You are sexually active and are younger than 48 years of age. ? You are older than 48 years of age and your health care provider tells you that you are at risk for this type of infection. ? Your sexual  activity has changed since you were last screened and you are at an increased risk for chlamydia or gonorrhea. Ask your health care provider if you are at risk.  If you do not have HIV, but are at risk, it may be recommended that you take a prescription medicine daily to prevent HIV infection. This is called pre-exposure prophylaxis (PrEP). You are considered at risk if: ? You are sexually active and do not regularly use condoms or know the HIV status of your partner(s). ? You   take drugs by injection. ? You are sexually active with a partner who has HIV.  Talk with your health care provider about whether you are at high risk of being infected with HIV. If you choose to begin PrEP, you should first be tested for HIV. You should then be tested every 3 months for as long as you are taking PrEP. Pregnancy  If you are premenopausal and you may become pregnant, ask your health care provider about preconception counseling.  If you may become pregnant, take 400 to 800 micrograms (mcg) of folic acid every day.  If you want to prevent pregnancy, talk to your health care provider about birth control (contraception). Osteoporosis and menopause  Osteoporosis is a disease in which the bones lose minerals and strength with aging. This can result in serious bone fractures. Your risk for osteoporosis can be identified using a bone density scan.  If you are 65 years of age or older, or if you are at risk for osteoporosis and fractures, ask your health care provider if you should be screened.  Ask your health care provider whether you should take a calcium or vitamin D supplement to lower your risk for osteoporosis.  Menopause may have certain physical symptoms and risks.  Hormone replacement therapy may reduce some of these symptoms and risks. Talk to your health care provider about whether hormone replacement therapy is right for you. Follow these instructions at home:  Schedule regular health, dental,  and eye exams.  Stay current with your immunizations.  Do not use any tobacco products including cigarettes, chewing tobacco, or electronic cigarettes.  If you are pregnant, do not drink alcohol.  If you are breastfeeding, limit how much and how often you drink alcohol.  Limit alcohol intake to no more than 1 drink per day for nonpregnant women. One drink equals 12 ounces of beer, 5 ounces of wine, or 1 ounces of hard liquor.  Do not use street drugs.  Do not share needles.  Ask your health care provider for help if you need support or information about quitting drugs.  Tell your health care provider if you often feel depressed.  Tell your health care provider if you have ever been abused or do not feel safe at home. This information is not intended to replace advice given to you by your health care provider. Make sure you discuss any questions you have with your health care provider. Document Released: 07/22/2010 Document Revised: 06/14/2015 Document Reviewed: 10/10/2014 Elsevier Interactive Patient Education  2018 [employer]  

## 2016-07-22 ENCOUNTER — Other Ambulatory Visit: Payer: Self-pay | Admitting: Internal Medicine

## 2016-07-22 ENCOUNTER — Encounter: Payer: Self-pay | Admitting: Internal Medicine

## 2016-07-22 ENCOUNTER — Other Ambulatory Visit (INDEPENDENT_AMBULATORY_CARE_PROVIDER_SITE_OTHER): Payer: BLUE CROSS/BLUE SHIELD

## 2016-07-22 ENCOUNTER — Ambulatory Visit (INDEPENDENT_AMBULATORY_CARE_PROVIDER_SITE_OTHER): Payer: BLUE CROSS/BLUE SHIELD | Admitting: Internal Medicine

## 2016-07-22 VITALS — BP 140/92 | HR 66 | Temp 98.1°F | Resp 16 | Ht 68.0 in | Wt 162.0 lb

## 2016-07-22 DIAGNOSIS — R030 Elevated blood-pressure reading, without diagnosis of hypertension: Secondary | ICD-10-CM | POA: Diagnosis not present

## 2016-07-22 DIAGNOSIS — F419 Anxiety disorder, unspecified: Secondary | ICD-10-CM | POA: Diagnosis not present

## 2016-07-22 DIAGNOSIS — R002 Palpitations: Secondary | ICD-10-CM | POA: Insufficient documentation

## 2016-07-22 DIAGNOSIS — D649 Anemia, unspecified: Secondary | ICD-10-CM

## 2016-07-22 DIAGNOSIS — Z0000 Encounter for general adult medical examination without abnormal findings: Secondary | ICD-10-CM

## 2016-07-22 DIAGNOSIS — I471 Supraventricular tachycardia: Secondary | ICD-10-CM | POA: Diagnosis not present

## 2016-07-22 DIAGNOSIS — J4520 Mild intermittent asthma, uncomplicated: Secondary | ICD-10-CM

## 2016-07-22 DIAGNOSIS — R7989 Other specified abnormal findings of blood chemistry: Secondary | ICD-10-CM | POA: Insufficient documentation

## 2016-07-22 LAB — COMPREHENSIVE METABOLIC PANEL
ALBUMIN: 4.1 g/dL (ref 3.5–5.2)
ALK PHOS: 40 U/L (ref 39–117)
ALT: 23 U/L (ref 0–35)
AST: 26 U/L (ref 0–37)
BILIRUBIN TOTAL: 0.4 mg/dL (ref 0.2–1.2)
BUN: 8 mg/dL (ref 6–23)
CALCIUM: 9.2 mg/dL (ref 8.4–10.5)
CO2: 26 mEq/L (ref 19–32)
Chloride: 103 mEq/L (ref 96–112)
Creatinine, Ser: 0.69 mg/dL (ref 0.40–1.20)
GFR: 96.44 mL/min (ref >60.00)
GLUCOSE: 85 mg/dL (ref 70–99)
Potassium: 4 mEq/L (ref 3.5–5.1)
Sodium: 137 mEq/L (ref 135–145)
TOTAL PROTEIN: 7.4 g/dL (ref 6.0–8.3)

## 2016-07-22 LAB — LIPID PANEL
CHOLESTEROL: 252 mg/dL — AB (ref 0–200)
HDL: 118.6 mg/dL (ref >39.00)
LDL Cholesterol: 96 mg/dL (ref 0–99)
NONHDL: 133.04
Total CHOL/HDL Ratio: 2
Triglycerides: 183 mg/dL — ABNORMAL HIGH (ref 0.0–149.0)
VLDL: 36.6 mg/dL (ref 0.0–40.0)

## 2016-07-22 LAB — CBC WITH DIFFERENTIAL/PLATELET
BASOS ABS: 0.1 10*3/uL (ref 0.0–0.1)
Basophils Relative: 1.4 % (ref 0.0–3.0)
EOS PCT: 13.8 % — AB (ref 0.0–5.0)
Eosinophils Absolute: 0.6 10*3/uL (ref 0.0–0.7)
HEMATOCRIT: 38.5 % (ref 36.0–46.0)
HEMOGLOBIN: 12.7 g/dL (ref 12.0–15.0)
LYMPHS PCT: 34 % (ref 12.0–46.0)
Lymphs Abs: 1.4 10*3/uL (ref 0.7–4.0)
MCHC: 33.1 g/dL (ref 30.0–36.0)
MCV: 90.8 fl (ref 78.0–100.0)
MONOS PCT: 9.3 % (ref 3.0–12.0)
Monocytes Absolute: 0.4 10*3/uL (ref 0.1–1.0)
NEUTROS PCT: 41.5 % — AB (ref 43.0–77.0)
Neutro Abs: 1.8 10*3/uL (ref 1.4–7.7)
Platelets: 205 10*3/uL (ref 150.0–400.0)
RBC: 4.24 Mil/uL (ref 3.87–5.11)
RDW: 15.7 % — ABNORMAL HIGH (ref 11.5–15.5)
WBC: 4.2 10*3/uL (ref 4.0–10.5)

## 2016-07-22 LAB — IRON: Iron: 125 ug/dL (ref 42–145)

## 2016-07-22 LAB — FERRITIN: Ferritin: 11 ng/mL (ref 10.0–291.0)

## 2016-07-22 LAB — TSH: TSH: 4.84 u[IU]/mL — AB (ref 0.35–4.50)

## 2016-07-22 NOTE — Assessment & Plan Note (Signed)
Takes lorazepam as needed - does not make her sleepy, works well She tries not to take it often

## 2016-07-22 NOTE — Assessment & Plan Note (Signed)
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

## 2016-07-22 NOTE — Assessment & Plan Note (Addendum)
occ palpations Check tsh

## 2016-07-22 NOTE — Assessment & Plan Note (Signed)
Uses symbicort once daily proair occasionally Controlled Continue same

## 2016-07-22 NOTE — Assessment & Plan Note (Signed)
Elevated when she comes to the doctor - also under increased stress Well controlled when she donates blood

## 2016-07-22 NOTE — Assessment & Plan Note (Signed)
Borderline anemia in past, low iron levels Check iron, ferritin and cbc

## 2016-07-24 NOTE — Telephone Encounter (Signed)
 controlled substance database checked.  Ok to fill medication. Rx printed.

## 2016-07-24 NOTE — Telephone Encounter (Signed)
Routing to dr burns, please advise, thanks 

## 2016-07-24 NOTE — Telephone Encounter (Signed)
F[facility]ed script to [facility]...Raechel Chute/lmb

## 2016-08-01 ENCOUNTER — Other Ambulatory Visit: Payer: Self-pay | Admitting: Internal Medicine

## 2016-11-06 ENCOUNTER — Other Ambulatory Visit: Payer: Self-pay | Admitting: Internal Medicine

## 2016-11-06 NOTE — Telephone Encounter (Signed)
[place] Controlled Substance Database checked. Last filled on 09/23/16

## 2016-11-06 NOTE — Telephone Encounter (Signed)
RX faxed to [facility] 

## 2016-12-16 ENCOUNTER — Other Ambulatory Visit (INDEPENDENT_AMBULATORY_CARE_PROVIDER_SITE_OTHER): Payer: BLUE CROSS/BLUE SHIELD

## 2016-12-16 DIAGNOSIS — R7989 Other specified abnormal findings of blood chemistry: Secondary | ICD-10-CM

## 2016-12-16 LAB — TSH: TSH: 3.57 u[IU]/mL (ref 0.35–4.50)

## 2016-12-17 ENCOUNTER — Encounter: Payer: Self-pay | Admitting: Internal Medicine

## 2016-12-22 ENCOUNTER — Ambulatory Visit (INDEPENDENT_AMBULATORY_CARE_PROVIDER_SITE_OTHER): Payer: BLUE CROSS/BLUE SHIELD | Admitting: Cardiovascular Disease

## 2016-12-22 ENCOUNTER — Encounter: Payer: Self-pay | Admitting: Cardiovascular Disease

## 2016-12-22 VITALS — BP 133/90 | HR 76 | Ht 68.0 in | Wt 169.0 lb

## 2016-12-22 DIAGNOSIS — I471 Supraventricular tachycardia: Secondary | ICD-10-CM | POA: Diagnosis not present

## 2016-12-22 NOTE — Patient Instructions (Signed)
Dr Croitoru recommends that you follow-up with him as needed. 

## 2016-12-22 NOTE — Progress Notes (Signed)
Cardiology Office Note:    Date:  12/22/2016   ID:  Jennifer Stephens, DOB 09-May-1970, MRN [id number]  PCP:  Pincus SanesBurns, Stacy J, MD  Cardiologist:  Thurmon FairMihai Ares Tegtmeyer, MD    Referring MD: Pincus SanesBurns, Stacy J, MD   Chief Complaint  Patient presents with  . Follow-up    pt reports no complaints   History of Present Illness:    Jennifer Stephens is a 46 y.o. 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  She is very rarely bothered by arrhythmia.  Occasionally in the morning she will wake up and have issues with anxiety and palpitations, but this feels different from the SVT, probably sinus tachycardia.  She has not had a sustained episode of SVT in several years.  She still has occasional isolated skipped beats.  The patient specifically denies any chest pain at rest exertion, dyspnea at rest or with exertion, orthopnea, paroxysmal nocturnal dyspnea, syncope, focal neurological deficits, intermittent claudication, lower extremity edema, unexplained weight gain, cough, hemoptysis or wheezing.   Past Medical History:  Diagnosis Date  . Abnormal Pap smear 12/2011   Dr Tenny Crawoss  . Anemia    in context blood donor & vegetarian status  . Asthma   . Eosinophilia   . 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 CERVIX SURGERY  12/2011  . FLEXIBLE SIGMOIDOSCOPY  1999   Negative, Dr Virginia Rochesterrr( done for rectal bleeding)  . Gravida 2 Para 2     Dr Tenny Crawoss  . WISDOM TOOTH EXTRACTION  1994    Current Medications: No outpatient medications have been marked as taking for the 12/22/16 encounter (Office Visit) with Thurmon Fairroitoru, Mirielle Byrum, MD.     Allergies:   Patient has no known allergies.   Social History   Socioeconomic History  . Marital status: Married    Spouse name: Not on file  . Number of  children: Not on file  . Years of education: Not on file  . Highest education level: Not on file  Social Needs  . Financial resource strain: Not on file  . Food insecurity - worry: Not on file  . Food insecurity - inability: Not on file  . Transportation needs - medical: Not on file  . Transportation needs - non-medical: Not on file  Occupational History  . Not on file  Tobacco Use  . Smoking status: Former Smoker    Last attempt to quit: 01/21/1996    Years since quitting: 20.9  . Smokeless tobacco: Never Used  . Tobacco comment: age [age] , up to 1 ppd  Substance and Sexual Activity  . Alcohol use: Yes    Comment: 1-2 per day - glasses of wine  . Drug use: No  . Sexual activity: Not on file  Other Topics Concern  . Not on file  Social History Narrative   Divorced, two kids      Exercise - walks the dogs, does yoga at home     Family History: The patient's family history includes Alcoholism in her father; Atrial fibrillation in her mother; Coronary artery disease in her father; Emphysema in her father; Heart attack (age of onset: 7470) in her father; Prostate cancer in her father. There is no history of Diabetes, Hyperlipidemia,  or Stroke. ROS:   Please see the history of present illness.     All other systems reviewed and are negative.  EKGs/Labs/Other Studies Reviewed:    The following studies were reviewed today:  EKG:  EKG is ordered today.  The ekg ordered today demonstrates NSR  Recent Labs: 07/22/2016: ALT 23; BUN 8; Creatinine, Ser 0.69; Hemoglobin 12.7; Platelets 205.0; Potassium 4.0; Sodium 137 12/16/2016: TSH 3.57  Recent Lipid Panel    Component Value Date/Time   CHOL 252 (H) 07/22/2016 0826   TRIG 183.0 (H) 07/22/2016 0826   HDL 118.60 07/22/2016 0826   CHOLHDL 2 07/22/2016 0826   VLDL 36.6 07/22/2016 0826   LDLCALC 96 07/22/2016 0826   LDLDIRECT 69.4 03/01/2013 1138    Physical Exam:    VS:  BP 133/90   Pulse 76   Ht 5\' 8"  (1.727 m)   Wt 169 lb  (76.7 kg)   BMI 25.70 kg/m     Wt Readings from Last 3 Encounters:  12/22/16 169 lb (76.7 kg)  07/22/16 162 lb (73.5 kg)  07/16/15 166 lb (75.3 kg)     GEN:  Well nourished, well developed in no acute distress HEENT: Normal NECK: No JVD; No carotid bruits LYMPHATICS: No lymphadenopathy CARDIAC: RRR, no murmurs, rubs, gallops RESPIRATORY:  Clear to auscultation without rales, wheezing or rhonchi  ABDOMEN: Soft, non-tender, non-distended MUSCULOSKELETAL:  No edema; No deformity  SKIN: Warm and dry NEUROLOGIC:  Alert and oriented x 3 PSYCHIATRIC:  Normal affect   ASSESSMENT:    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 AVNRT - very infrequent episodes, even without medications, no specific therapy needed.  Reminded her she can use the vagal maneuvers to terminate it if it does occur.  Offered to refill her diltiazem if she feels that she needs it. 2. Anxiety -continues to have issues with anxiety related to the fact that she is a single mom.  Seems to be handling it well though.   Medication Adjustments/Labs and Tests Ordered: Current medicines are reviewed at length with the patient today.  Concerns regarding medicines are outlined above.  Orders Placed This Encounter  Procedures  . EKG 12-Lead   No orders of the defined types were placed in this encounter.   Signed, Thurmon FairMihai Jurrell Royster, MD  12/22/2016 9:15 AM    [facility]

## 2017-01-17 ENCOUNTER — Other Ambulatory Visit: Payer: Self-pay | Admitting: Internal Medicine

## 2017-01-19 MED ORDER — LORAZEPAM 0.5 MG PO TABS: 0.5000 mg | ORAL_TABLET | Freq: Three times a day (TID) | ORAL | 1 refills | Status: DC | PRN

## 2017-01-19 NOTE — Telephone Encounter (Signed)
Controlled Substance Database checked. Last filled on 12/16/16 

## 2017-01-19 NOTE — Addendum Note (Signed)
Addended by: Zenovia JordanMITCHELL, Blessen Kimbrough B on: 01/19/2017 07:47 AM   Modules accepted: Orders

## 2017-02-06 ENCOUNTER — Ambulatory Visit: Payer: BLUE CROSS/BLUE SHIELD | Admitting: Physician Assistant

## 2017-05-25 LAB — HM PAP SMEAR

## 2017-05-25 LAB — HM MAMMOGRAPHY

## 2017-06-20 ENCOUNTER — Other Ambulatory Visit: Payer: Self-pay | Admitting: Internal Medicine

## 2017-06-22 NOTE — Telephone Encounter (Signed)
[place] Controlled Substance Database checked. Last filled on 05/16/17

## 2017-07-19 ENCOUNTER — Other Ambulatory Visit: Payer: Self-pay | Admitting: Internal Medicine

## 2017-07-20 NOTE — Telephone Encounter (Signed)
[place] Controlled Substance Database checked. Last filled on 06/27/17

## 2017-07-30 NOTE — Patient Instructions (Addendum)
Test(s) ordered today. Your results will be released to MyChart (or called to you) after review, usually within 72hours after test completion. If any changes need to be made, you will be notified at that same time.  All other Health Maintenance issues reviewed.   All recommended immunizations and age-appropriate screenings are up-to-date or discussed.  No immunizations administered today.   Medications reviewed and updated.  No changes recommended at this time.   Please followup in 1 year   Health Maintenance, Female Adopting a healthy lifestyle and getting preventive care can go a long way to promote health and wellness. Talk with your health care provider about what schedule of regular examinations is right for you. This is a good chance for you to check in with your provider about disease prevention and staying healthy. In between checkups, there are plenty of things you can do on your own. Experts have done a lot of research about which lifestyle changes and preventive measures are most likely to keep you healthy. Ask your health care provider for more information. Weight and diet Eat a healthy diet  Be sure to include plenty of vegetables, fruits, low-fat dairy products, and lean protein.  Do not eat a lot of foods high in solid fats, added sugars, or salt.  Get regular exercise. This is one of the most important things you can do for your health. ? Most adults should exercise for at least 150 minutes each week. The exercise should increase your heart rate and make you sweat (moderate-intensity exercise). ? Most adults should also do strengthening exercises at least twice a week. This is in addition to the moderate-intensity exercise.  Maintain a healthy weight  Body mass index (BMI) is a measurement that can be used to identify possible weight problems. It estimates body fat based on height and weight. Your health care provider can help determine your BMI and help you achieve or  maintain a healthy weight.  For females 20 years of age and older: ? A BMI below 18.5 is considered underweight. ? A BMI of 18.5 to 24.9 is normal. ? A BMI of 25 to 29.9 is considered overweight. ? A BMI of 30 and above is considered obese.  Watch levels of cholesterol and blood lipids  You should start having your blood tested for lipids and cholesterol at 49 years of age, then have this test every 5 years.  You may need to have your cholesterol levels checked more often if: ? Your lipid or cholesterol levels are high. ? You are older than 50 years of age. ? You are at high risk for heart disease.  Cancer screening Lung Cancer  Lung cancer screening is recommended for adults 55-80 years old who are at high risk for lung cancer because of a history of smoking.  A yearly low-dose CT scan of the lungs is recommended for people who: ? Currently smoke. ? Have quit within the past 15 years. ? Have at least a 30-pack-year history of smoking. A pack year is smoking an average of one pack of cigarettes a day for 1 year.  Yearly screening should continue until it has been 15 years since you quit.  Yearly screening should stop if you develop a health problem that would prevent you from having lung cancer treatment.  Breast Cancer  Practice breast self-awareness. This means understanding how your breasts normally appear and feel.  It also means doing regular breast self-exams. Let your health care provider know about any changes,   changes, no matter how small.  If you are in your 20s or 30s, you should have a clinical breast exam (CBE) by a health care provider every 1-3 years as part of a regular health exam.  If you are 40 or older, have a CBE every year. Also consider having a breast X-ray (mammogram) every year.  If you have a family history of breast cancer, talk to your health care provider about genetic screening.  If you are at high risk for breast cancer, talk to your health care  provider about having an MRI and a mammogram every year.  Breast cancer gene (BRCA) assessment is recommended for women who have family members with BRCA-related cancers. BRCA-related cancers include: ? Breast. ? Ovarian. ? Tubal. ? Peritoneal cancers.  Results of the assessment will determine the need for genetic counseling and BRCA1 and BRCA2 testing.  Cervical Cancer Your health care provider may recommend that you be screened regularly for cancer of the pelvic organs (ovaries, uterus, and vagina). This screening involves a pelvic examination, including checking for microscopic changes to the surface of your cervix (Pap test). You may be encouraged to have this screening done every 3 years, beginning at age 21.  For women ages 30-65, health care providers may recommend pelvic exams and Pap testing every 3 years, or they may recommend the Pap and pelvic exam, combined with testing for human papilloma virus (HPV), every 5 years. Some types of HPV increase your risk of cervical cancer. Testing for HPV may also be done on women of any age with unclear Pap test results.  Other health care providers may not recommend any screening for nonpregnant women who are considered low risk for pelvic cancer and who do not have symptoms. Ask your health care provider if a screening pelvic exam is right for you.  If you have had past treatment for cervical cancer or a condition that could lead to cancer, you need Pap tests and screening for cancer for at least 20 years after your treatment. If Pap tests have been discontinued, your risk factors (such as having a new sexual partner) need to be reassessed to determine if screening should resume. Some women have medical problems that increase the chance of getting cervical cancer. In these cases, your health care provider may recommend more frequent screening and Pap tests.  Colorectal Cancer  This type of cancer can be detected and often prevented.  Routine  colorectal cancer screening usually begins at 50 years of age and continues through 49 years of age.  Your health care provider may recommend screening at an earlier age if you have risk factors for colon cancer.  Your health care provider may also recommend using home test kits to check for hidden blood in the stool.  A small camera at the end of a tube can be used to examine your colon directly (sigmoidoscopy or colonoscopy). This is done to check for the earliest forms of colorectal cancer.  Routine screening usually begins at age 50.  Direct examination of the colon should be repeated every 5-10 years through 49 years of age. However, you may need to be screened more often if early forms of precancerous polyps or small growths are found.  Skin Cancer  Check your skin from head to toe regularly.  Tell your health care provider about any new moles or changes in moles, especially if there is a change in a mole's shape or color.  Also tell your health care provider if   you have a mole that is larger than the size of a pencil eraser.  Always use sunscreen. Apply sunscreen liberally and repeatedly throughout the day.  Protect yourself by wearing long sleeves, pants, a wide-brimmed hat, and sunglasses whenever you are outside.  Heart disease, diabetes, and high blood pressure  High blood pressure causes heart disease and increases the risk of stroke. High blood pressure is more likely to develop in: ? People who have blood pressure in the high end of the normal range (130-139/85-89 mm Hg). ? People who are overweight or obese. ? People who are African American.  If you are 55-1 years of age, have your blood pressure checked every 3-5 years. If you are 86 years of age or older, have your blood pressure checked every year. You should have your blood pressure measured twice-once when you are at a hospital or clinic, and once when you are not at a hospital or clinic. Record the average of the  two measurements. To check your blood pressure when you are not at a hospital or clinic, you can use: ? An automated blood pressure machine at a pharmacy. ? A home blood pressure monitor.  If you are between 15 years and 81 years old, ask your health care provider if you should take aspirin to prevent strokes.  Have regular diabetes screenings. This involves taking a blood sample to check your fasting blood sugar level. ? If you are at a normal weight and have a low risk for diabetes, have this test once every three years after 49 years of age. ? If you are overweight and have a high risk for diabetes, consider being tested at a younger age or more often. Preventing infection Hepatitis B  If you have a higher risk for hepatitis B, you should be screened for this virus. You are considered at high risk for hepatitis B if: ? You were born in a country where hepatitis B is common. Ask your health care provider which countries are considered high risk. ? Your parents were born in a high-risk country, and you have not been immunized against hepatitis B (hepatitis B vaccine). ? You have HIV or AIDS. ? You use needles to inject street drugs. ? You live with someone who has hepatitis B. ? You have had sex with someone who has hepatitis B. ? You get hemodialysis treatment. ? You take certain medicines for conditions, including cancer, organ transplantation, and autoimmune conditions.  Hepatitis C  Blood testing is recommended for: ? Everyone born from [birth year] through 1967. ? Anyone with known risk factors for hepatitis C.  Sexually transmitted infections (STIs)  You should be screened for sexually transmitted infections (STIs) including gonorrhea and chlamydia if: ? You are sexually active and are younger than 49 years of age. ? You are older than 49 years of age and your health care provider tells you that you are at risk for this type of infection. ? Your sexual activity has changed since you  were last screened and you are at an increased risk for chlamydia or gonorrhea. Ask your health care provider if you are at risk.  If you do not have HIV, but are at risk, it may be recommended that you take a prescription medicine daily to prevent HIV infection. This is called pre-exposure prophylaxis (PrEP). You are considered at risk if: ? You are sexually active and do not regularly use condoms or know the HIV status of your partner(s). ? You take drugs by  injection. ? You are sexually active with a partner who has HIV.  Talk with your health care provider about whether you are at high risk of being infected with HIV. If you choose to begin PrEP, you should first be tested for HIV. You should then be tested every 3 months for as long as you are taking PrEP. Pregnancy  If you are premenopausal and you may become pregnant, ask your health care provider about preconception counseling.  If you may become pregnant, take 400 to 800 micrograms (mcg) of folic acid every day.  If you want to prevent pregnancy, talk to your health care provider about birth control (contraception). Osteoporosis and menopause  Osteoporosis is a disease in which the bones lose minerals and strength with aging. This can result in serious bone fractures. Your risk for osteoporosis can be identified using a bone density scan.  If you are 69 years of age or older, or if you are at risk for osteoporosis and fractures, ask your health care provider if you should be screened.  Ask your health care provider whether you should take a calcium or vitamin D supplement to lower your risk for osteoporosis.  Menopause may have certain physical symptoms and risks.  Hormone replacement therapy may reduce some of these symptoms and risks. Talk to your health care provider about whether hormone replacement therapy is right for you. Follow these instructions at home:  Schedule regular health, dental, and eye exams.  Stay current  with your immunizations.  Do not use any tobacco products including cigarettes, chewing tobacco, or electronic cigarettes.  If you are pregnant, do not drink alcohol.  If you are breastfeeding, limit how much and how often you drink alcohol.  Limit alcohol intake to no more than 1 drink per day for nonpregnant women. One drink equals 12 ounces of beer, 5 ounces of wine, or 1 ounces of hard liquor.  Do not use street drugs.  Do not share needles.  Ask your health care provider for help if you need support or information about quitting drugs.  Tell your health care provider if you often feel depressed.  Tell your health care provider if you have ever been abused or do not feel safe at home. This information is not intended to replace advice given to you by your health care provider. Make sure you discuss any questions you have with your health care provider. Document Released: 07/22/2010 Document Revised: 06/14/2015 Document Reviewed: 10/10/2014 Elsevier Interactive Patient Education  [employer].

## 2017-07-30 NOTE — Progress Notes (Signed)
Subjective:    Patient ID: Jennifer Stephens, female    DOB: 11/20/1970, 47 y.o.   MRN: [id number]  HPI She is here for a physical exam.   She is using the symbicort once daily.  She rarely uses the Albuterol.    Using ativan 1/3 month - she uses it only as needed.     She has not concerns.    Medications and allergies reviewed with patient and updated if appropriate.  Patient Active Problem List   Diagnosis Date Noted  . Palpitations 07/22/2016  . Elevated TSH 07/22/2016  . Abnormal mammogram 05/15/2016  . Elevated blood pressure reading 07/16/2015  . Anxiety 01/23/2014  . 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 Atypical squamous cells of undetermined significance on cytologic smear of cervix (ASC-US) 01/02/2012  . 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 Anemia 05/23/2008  . Mild intermittent asthma 03/15/2007    Current Outpatient Medications on File Prior to Visit  Medication Sig Dispense Refill  . LORazepam (ATIVAN) 0.5 MG tablet TAKE ONE TABLET BY MOUTH EVERY 8 HOURS AS NEEDED FOR ANXIETY 30 tablet 0  . PROAIR HFA 108 (90 Base) MCG/ACT inhaler INHALE 1-2 PUFFS INTO THE LUNGS EVERY 4 HOURS AS NEEDED. 8.5 each 2  . SYMBICORT 80-4.5 MCG/ACT inhaler INHALE 1 TO 2 PUFFS INTO THE LUNGS EVERY 12 HOURS (SPIT AND GARGLE AFTER USE) 10.2 g 10   No current facility-administered medications on file prior to visit.     Past Medical History:  Diagnosis Date  . Abnormal Pap smear 12/2011   Dr Tenny Craw  . Anemia    in context blood donor & vegetarian status  . Asthma   . Eosinophilia   . 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 CERVIX SURGERY  12/2011  . FLEXIBLE SIGMOIDOSCOPY  1999   Negative, Dr Virginia Rochester( done for rectal bleeding)  . Gravida 2 Para 2     Dr Tenny Craw  . WISDOM TOOTH EXTRACTION  1994    Social History   Socioeconomic History  . Marital status: Married    Spouse name: Not on file  . Number of  children: Not on file  . Years of education: Not on file  . Highest education level: Not on file  Occupational History  . Not on file  Social Needs  . Financial resource strain: Not on file  . Food insecurity:    Worry: Not on file    Inability: Not on file  . Transportation needs:    Medical: Not on file    Non-medical: Not on file  Tobacco Use  . Smoking status: Former Smoker    Last attempt to quit: 01/21/1996    Years since quitting: 21.5  . Smokeless tobacco: Never Used  . Tobacco comment: age [age] , up to 1 ppd  Substance and Sexual Activity  . Alcohol use: Yes    Comment: 1-2 per day - glasses of wine  . Drug use: No  . Sexual activity: Not on file  Lifestyle  . Physical activity:    Days per week: Not on file    Minutes per session: Not on file  . Stress: Not on file  Relationships  . Social connections:    Talks on phone: Not on file    Gets together: Not on file    Attends religious service: Not on file    Active member of club or  organization: Not on file    Attends meetings of clubs or organizations: Not on file    Relationship status: Not on file  Other Topics Concern  . Not on file  Social History Narrative   Divorced, two kids      Exercise - walks the dogs, does yoga at home    Family History  Problem Relation Age of Onset  . Prostate cancer Father   . Heart attack Father 2070  . Emphysema Father   . Coronary artery disease Father        CABG four vessel  . Alcoholism Father   . Atrial fibrillation Mother   . Diabetes Neg Hx   . Hyperlipidemia Neg Hx   . Stroke Neg Hx     Review of Systems  Constitutional: Negative for chills and fever.  Eyes: Negative for visual disturbance.  Respiratory: Negative for cough, shortness of breath and wheezing.   Cardiovascular: Negative for chest pain, palpitations and leg swelling.  Gastrointestinal: Negative for abdominal pain, blood in stool, constipation, diarrhea and nausea.       No gerd    Genitourinary: Negative for dysuria and hematuria.  Skin: Negative for color change and rash.  Neurological: Negative for dizziness, light-headedness and numbness.  Psychiatric/Behavioral: Positive for dysphoric mood (occ, not a problem). The patient is nervous/anxious.        Objective:   Vitals:   07/31/17 0850  BP: 120/72  Pulse: 65  Temp: 98.2 F (36.8 C)   Filed Weights   07/31/17 0850  Weight: 166 lb (75.3 kg)   Body mass index is 25.24 kg/m.  Wt Readings from Last 3 Encounters:  07/31/17 166 lb (75.3 kg)  12/22/16 169 lb (76.7 kg)  07/22/16 162 lb (73.5 kg)     Physical Exam Constitutional: She appears well-developed and well-nourished. No distress.  HENT:  Head: Normocephalic and atraumatic.  Right Ear: External ear normal. Normal ear canal and TM Left Ear: External ear normal.  Normal ear canal and TM Mouth/Throat: Oropharynx is clear and moist.  Eyes: Conjunctivae and EOM are normal.  Neck: Neck supple. No tracheal deviation present. No thyromegaly present.  No carotid bruit  Cardiovascular: Normal rate, regular rhythm and normal heart sounds.   No murmur heard.  No edema. Pulmonary/Chest: Effort normal and breath sounds normal. No respiratory distress. She has no wheezes. She has no rales.  Breast: deferred to Gyn Abdominal: Soft. She exhibits no distension. There is no tenderness.  Lymphadenopathy: She has no cervical adenopathy.  Skin: Skin is warm and dry. She is not diaphoretic.  Psychiatric: She has a normal mood and affect. Her behavior is normal.        Assessment & Plan:   Physical exam: Screening blood work   ordered Immunizations   Up to date  Mammogram  Up to date  Gyn   Up to date  Eye exams  Up to date  Exercise  Walks, runs Weight  Normal BMI Skin  Sees derm - no concerns Substance abuse  Moderate alchohol use  See Problem List for Assessment and Plan of chronic medical problems.   FU in one year

## 2017-07-31 ENCOUNTER — Ambulatory Visit (INDEPENDENT_AMBULATORY_CARE_PROVIDER_SITE_OTHER): Payer: BLUE CROSS/BLUE SHIELD | Admitting: Internal Medicine

## 2017-07-31 ENCOUNTER — Encounter: Payer: Self-pay | Admitting: Internal Medicine

## 2017-07-31 ENCOUNTER — Other Ambulatory Visit (INDEPENDENT_AMBULATORY_CARE_PROVIDER_SITE_OTHER): Payer: BLUE CROSS/BLUE SHIELD

## 2017-07-31 VITALS — BP 120/72 | HR 65 | Temp 98.2°F | Ht 68.0 in | Wt 166.0 lb

## 2017-07-31 DIAGNOSIS — I471 Supraventricular tachycardia: Secondary | ICD-10-CM

## 2017-07-31 DIAGNOSIS — J4520 Mild intermittent asthma, uncomplicated: Secondary | ICD-10-CM | POA: Diagnosis not present

## 2017-07-31 DIAGNOSIS — Z0000 Encounter for general adult medical examination without abnormal findings: Secondary | ICD-10-CM

## 2017-07-31 DIAGNOSIS — F419 Anxiety disorder, unspecified: Secondary | ICD-10-CM

## 2017-07-31 DIAGNOSIS — R7989 Other specified abnormal findings of blood chemistry: Secondary | ICD-10-CM

## 2017-07-31 LAB — LIPID PANEL
CHOLESTEROL: 272 mg/dL — AB (ref 0–200)
HDL: 126.8 mg/dL (ref >39.00)
LDL CALC: 115 mg/dL — AB (ref 0–99)
NonHDL: 145.16
TRIGLYCERIDES: 153 mg/dL — AB (ref 0.0–149.0)
Total CHOL/HDL Ratio: 2
VLDL: 30.6 mg/dL (ref 0.0–40.0)

## 2017-07-31 LAB — CBC WITH DIFFERENTIAL/PLATELET
BASOS PCT: 2.7 % (ref 0.0–3.0)
Basophils Absolute: 0.1 10*3/uL (ref 0.0–0.1)
Eosinophils Absolute: 0.7 10*3/uL (ref 0.0–0.7)
HEMATOCRIT: 41.1 % (ref 36.0–46.0)
HEMOGLOBIN: 14 g/dL (ref 12.0–15.0)
Lymphocytes Relative: 31.2 % (ref 12.0–46.0)
Lymphs Abs: 1.3 10*3/uL (ref 0.7–4.0)
MCHC: 34.1 g/dL (ref 30.0–36.0)
MCV: 96.2 fl (ref 78.0–100.0)
MONO ABS: 0.4 10*3/uL (ref 0.1–1.0)
Monocytes Relative: 8.6 % (ref 3.0–12.0)
NEUTROS ABS: 1.7 10*3/uL (ref 1.4–7.7)
Neutrophils Relative %: 41.6 % — ABNORMAL LOW (ref 43.0–77.0)
PLATELETS: 240 10*3/uL (ref 150.0–400.0)
RBC: 4.27 Mil/uL (ref 3.87–5.11)
RDW: 14.1 % (ref 11.5–15.5)
WBC: 4.2 10*3/uL (ref 4.0–10.5)

## 2017-07-31 LAB — COMPREHENSIVE METABOLIC PANEL
ALT: 64 U/L — ABNORMAL HIGH (ref 0–35)
AST: 60 U/L — ABNORMAL HIGH (ref 0–37)
Albumin: 4.2 g/dL (ref 3.5–5.2)
Alkaline Phosphatase: 47 U/L (ref 39–117)
BUN: 6 mg/dL (ref 6–23)
CALCIUM: 9 mg/dL (ref 8.4–10.5)
CHLORIDE: 102 meq/L (ref 96–112)
CO2: 25 meq/L (ref 19–32)
Creatinine, Ser: 0.64 mg/dL (ref 0.40–1.20)
GFR: 104.74 mL/min (ref >60.00)
Glucose, Bld: 81 mg/dL (ref 70–99)
POTASSIUM: 3.6 meq/L (ref 3.5–5.1)
SODIUM: 139 meq/L (ref 135–145)
Total Bilirubin: 0.4 mg/dL (ref 0.2–1.2)
Total Protein: 7.5 g/dL (ref 6.0–8.3)

## 2017-07-31 LAB — TSH: TSH: 4.13 u[IU]/mL (ref 0.35–4.50)

## 2017-08-02 ENCOUNTER — Encounter: Payer: Self-pay | Admitting: Internal Medicine

## 2017-08-02 NOTE — Assessment & Plan Note (Signed)
No palpitations Saw cardiology 6 months ago

## 2017-08-02 NOTE — Assessment & Plan Note (Signed)
Uses symbicort once a day Albuterol prn - rarely Overall controlled Continue

## 2017-08-02 NOTE — Assessment & Plan Note (Signed)
Takes lorazepam prn but has taken more recently due to moving continue

## 2017-08-02 NOTE — Assessment & Plan Note (Signed)
tsh

## 2017-08-24 ENCOUNTER — Other Ambulatory Visit: Payer: Self-pay | Admitting: Internal Medicine

## 2017-08-24 NOTE — Telephone Encounter (Signed)
[place] Controlled Substance Database checked. Last filled on 07/31/17 10 day supply

## 2017-09-18 ENCOUNTER — Other Ambulatory Visit: Payer: Self-pay | Admitting: Internal Medicine

## 2017-09-18 NOTE — Telephone Encounter (Signed)
Last refill was 08/25/17 Last OV 07/31/17 Next OV not made

## 2017-10-10 ENCOUNTER — Other Ambulatory Visit: Payer: Self-pay | Admitting: Internal Medicine

## 2017-10-12 NOTE — Telephone Encounter (Signed)
Last refill was 09/25/17 Last OV was 07/31/17 Next OV is not on file

## 2017-11-14 ENCOUNTER — Other Ambulatory Visit: Payer: Self-pay | Admitting: Internal Medicine

## 2017-11-16 NOTE — Telephone Encounter (Signed)
Last refill was 10/12/17 Last OV was 07/31/17 Next OV is not listed

## 2017-12-04 ENCOUNTER — Other Ambulatory Visit: Payer: Self-pay | Admitting: Internal Medicine

## 2017-12-04 NOTE — Telephone Encounter (Signed)
[place] Controlled Substance Database checked. Last filled on 11/17/17  Last OV 07/31/17

## 2018-01-11 ENCOUNTER — Other Ambulatory Visit: Payer: Self-pay | Admitting: Internal Medicine

## 2018-01-11 NOTE — Telephone Encounter (Signed)
Done erx 

## 2018-01-11 NOTE — Telephone Encounter (Signed)
[place] Controlled Database Checked Last filled: 12/09/17 # 30 LOV w/PCP: 07/31/17 Next appt w/PCP: None

## 2018-02-08 ENCOUNTER — Other Ambulatory Visit: Payer: Self-pay | Admitting: Internal Medicine

## 2018-02-08 NOTE — Telephone Encounter (Signed)
Last refill was 01/16/18 Last OV was 07/31/17 Next OV Not on file

## 2018-03-21 ENCOUNTER — Other Ambulatory Visit: Payer: Self-pay | Admitting: Internal Medicine

## 2018-03-22 NOTE — Telephone Encounter (Signed)
[place] Controlled Substance Database checked. Last filled on 02/20/18  Last OV 07/31/17

## 2018-04-06 ENCOUNTER — Telehealth: Payer: Self-pay | Admitting: Internal Medicine

## 2018-04-06 MED ORDER — ALBUTEROL SULFATE HFA 108 (90 BASE) MCG/ACT IN AERS: INHALATION_SPRAY | RESPIRATORY_TRACT | 1 refills | Status: AC

## 2018-04-06 NOTE — Telephone Encounter (Signed)
Copied from CRM [id number]. Topic: Quick Communication - Rx Refill/Question >> Apr 06, 2018  1:47 PM Wyonia Hough E wrote: Medication: PROAIR HFA 108 (90 Base) MCG/ACT inhaler  Has the patient contacted their pharmacy? Yes - No refills    Preferred Pharmacy (with phone number or street name): [facility] [address] [place] - [address] [phone] (Phone) [phone] (Fax)    Agent: Please be advised that RX refills may take up to 3 business days. We ask that you follow-up with your pharmacy.

## 2018-04-06 NOTE — Telephone Encounter (Signed)
Rx sent 

## 2018-04-14 ENCOUNTER — Other Ambulatory Visit: Payer: Self-pay | Admitting: Internal Medicine

## 2018-04-14 NOTE — Telephone Encounter (Signed)
Last refill was 03/29/18 Last OV 07/31/17 Next OV NA

## 2018-05-04 ENCOUNTER — Encounter: Payer: Self-pay | Admitting: Internal Medicine

## 2018-05-11 ENCOUNTER — Other Ambulatory Visit: Payer: Self-pay | Admitting: Internal Medicine

## 2018-05-12 NOTE — Telephone Encounter (Signed)
Check  registry last filled 04/29/2018../lmb  

## 2018-05-14 ENCOUNTER — Encounter: Payer: Self-pay | Admitting: Internal Medicine

## 2018-05-18 NOTE — Telephone Encounter (Signed)
Spoke with patient and she advised she would like the first note we wrote re-written and faxed with a current date. Letter remade and faxed.

## 2018-07-07 ENCOUNTER — Other Ambulatory Visit: Payer: Self-pay | Admitting: Internal Medicine

## 2018-07-07 NOTE — Telephone Encounter (Signed)
Last refill was 05/28/18 Last OV 08/01/18 Next OV NA

## 2018-07-12 ENCOUNTER — Encounter: Payer: Self-pay | Admitting: Internal Medicine

## 2018-07-29 ENCOUNTER — Other Ambulatory Visit: Payer: Self-pay | Admitting: Internal Medicine

## 2018-07-29 NOTE — Telephone Encounter (Signed)
Last routine OV 07/31/17 Next OV not listed (mad note on rx she is due for OV) Last RF 07/07/18

## 2018-08-02 ENCOUNTER — Encounter: Payer: Self-pay | Admitting: Internal Medicine

## 2018-08-11 ENCOUNTER — Encounter: Payer: Self-pay | Admitting: Internal Medicine

## 2018-08-11 DIAGNOSIS — Z20822 Contact with and (suspected) exposure to covid-19: Secondary | ICD-10-CM

## 2018-08-11 DIAGNOSIS — Z20828 Contact with and (suspected) exposure to other viral communicable diseases: Secondary | ICD-10-CM

## 2018-08-12 NOTE — Telephone Encounter (Signed)
Please call her.  I think this is a complicated issue and there has been some confusion and it is too difficult to go back and forth via mychart.    I understand her concerns.  Unfortunately she does not qualify for disability - I can not take her out of work because she has controlled asthma and is concerned about getting covid.  That is basically what disability is - being taken out of work because she can not perform her job duties - she can physically perform her job duties.    The only option she has at this point is talking to her boss and seeing if she can be moved to a different position so that she does not need to go to the courthouse.  I can not write excuse her from her job duties if she can not do those exact duties at home remotely.

## 2018-08-12 NOTE — Telephone Encounter (Signed)
LVM for pt to call back in regards.  

## 2019-05-18 ENCOUNTER — Ambulatory Visit: Payer: Self-pay | Attending: Internal Medicine

## 2019-05-18 DIAGNOSIS — Z23 Encounter for immunization: Secondary | ICD-10-CM

## 2019-05-18 NOTE — Progress Notes (Signed)
   Covid-19 Vaccination Clinic  Name:  LORIELLE BOEHNING    MRN: [id number] DOB: 1971/01/07  05/18/2019  Ms. Williamsen was observed post Covid-19 immunization for 15 minutes without incident. She was provided with Vaccine Information Sheet and instruction to access the V-Safe system.   Ms. Liston was instructed to call 911 with any severe reactions post vaccine: Marland Kitchen Difficulty breathing  . Swelling of face and throat  . A fast heartbeat  . A bad rash all over body  . Dizziness and weakness   Immunizations Administered    Name Date Dose VIS Date Route   Pfizer COVID-19 Vaccine 05/18/2019  8:22 AM 0.3 mL 03/16/2018 Intramuscular   Manufacturer: [employer], [employer]   Lot: [id number]   NDC: [address]-1

## 2019-06-13 ENCOUNTER — Ambulatory Visit: Payer: Self-pay | Attending: Internal Medicine

## 2019-06-13 DIAGNOSIS — Z23 Encounter for immunization: Secondary | ICD-10-CM

## 2019-06-13 NOTE — Progress Notes (Signed)
   Covid-19 Vaccination Clinic  Name:  Jennifer Stephens    MRN: [id number] DOB: 01-08-1971  06/13/2019  [name] was observed post Covid-19 immunization for 15 minutes without incident. She was provided with Vaccine Information Sheet and instruction to access the V-Safe system.   [name] was instructed to call 911 with any severe reactions post vaccine: Marland Kitchen Difficulty breathing  . Swelling of face and throat  . A fast heartbeat  . A bad rash all over body  . Dizziness and weakness   Immunizations Administered    Name Date Dose VIS Date Route   Pfizer COVID-19 Vaccine 06/13/2019  8:21 AM 0.3 mL 03/16/2018 Intramuscular   Manufacturer: [employer], [employer]   Lot: [id number]   NDC: [address]-7

## 2019-10-19 ENCOUNTER — Encounter (HOSPITAL_COMMUNITY): Payer: Self-pay

## 2019-10-19 ENCOUNTER — Emergency Department (HOSPITAL_COMMUNITY): Payer: BLUE CROSS/BLUE SHIELD

## 2019-10-19 ENCOUNTER — Emergency Department (HOSPITAL_COMMUNITY)
Admission: EM | Admit: 2019-10-19 | Discharge: 2019-10-19 | Disposition: A | Discharge status: Home / Self Care | Payer: BLUE CROSS/BLUE SHIELD | Attending: Emergency Medicine | Admitting: Emergency Medicine

## 2019-10-19 ENCOUNTER — Other Ambulatory Visit: Payer: Self-pay

## 2019-10-19 DIAGNOSIS — W231XXA Caught, crushed, jammed, or pinched between stationary objects, initial encounter: Secondary | ICD-10-CM | POA: Diagnosis not present

## 2019-10-19 DIAGNOSIS — Z87891 Personal history of nicotine dependence: Secondary | ICD-10-CM | POA: Insufficient documentation

## 2019-10-19 DIAGNOSIS — S67190A Crushing injury of right index finger, initial encounter: Secondary | ICD-10-CM

## 2019-10-19 DIAGNOSIS — J4520 Mild intermittent asthma, uncomplicated: Secondary | ICD-10-CM | POA: Insufficient documentation

## 2019-10-19 DIAGNOSIS — S60940A Unspecified superficial injury of right index finger, initial encounter: Secondary | ICD-10-CM | POA: Diagnosis present

## 2019-10-19 DIAGNOSIS — Z7951 Long term (current) use of inhaled steroids: Secondary | ICD-10-CM | POA: Diagnosis not present

## 2019-10-19 DIAGNOSIS — S62630B Displaced fracture of distal phalanx of right index finger, initial encounter for open fracture: Secondary | ICD-10-CM | POA: Diagnosis not present

## 2019-10-19 MED ORDER — HYDROCODONE-ACETAMINOPHEN 5-325 MG PO TABS: 1.0000 | ORAL_TABLET | Freq: Four times a day (QID) | ORAL | 0 refills | Status: DC | PRN

## 2019-10-19 MED ORDER — CEPHALEXIN 500 MG PO CAPS
500.0000 mg | ORAL_CAPSULE | Freq: Once | ORAL | Status: AC
  Administered 2019-10-19: 500 mg via ORAL
  Filled 2019-10-19: qty 1

## 2019-10-19 MED ORDER — CEPHALEXIN 500 MG PO CAPS: 500.0000 mg | ORAL_CAPSULE | Freq: Four times a day (QID) | ORAL | 0 refills | Status: DC

## 2019-10-19 MED ORDER — LIDOCAINE HCL 2 % IJ SOLN
10.0000 mL | Freq: Once | INTRAMUSCULAR | Status: AC
  Administered 2019-10-19: 200 mg
  Filled 2019-10-19: qty 20

## 2019-10-19 NOTE — Discharge Instructions (Signed)
Please read and follow all provided instructions.  Your diagnoses today include:  1. Open displaced fracture of distal phalanx of right index finger, initial encounter   2. Crushing injury of right index finger, initial encounter     Tests performed today include:  X-ray of the affected area that shows a broken fingertip  Vital signs. See below for your results today.   Medications prescribed:   Vicodin (hydrocodone/acetaminophen) - narcotic pain medication  DO NOT combine with the lorazepam prescribed by your doctor as this can cause respiratory depression and can stop your breathing.  DO NOT drive or perform any activities that require you to be awake and alert because this medicine can make you drowsy. BE VERY CAREFUL not to take multiple medicines containing Tylenol (also called acetaminophen). Doing so can lead to an overdose which can damage your liver and cause liver failure and possibly death.   Keflex (cephalexin) - antibiotic  You have been prescribed an antibiotic medicine: take the entire course of medicine even if you are feeling better. Stopping early can cause the antibiotic not to work.  Take any prescribed medications only as directed.   Home care instructions:  Follow any educational materials and wound care instructions contained in this packet.   Keep affected area above the level of your heart when possible to minimize swelling. Wash area gently twice a day with warm soapy water. Do not apply alcohol or hydrogen peroxide. Cover the area if it draining or weeping.   Follow-up instructions: Follow up with Dr. Glenna Durand office for further care of your finger fracture, laceration, and skin avulsion.  Return instructions:  Return to the Emergency Department if you have:  Fever  Worsening pain  Worsening swelling of the wound  Pus draining from the wound  Redness of the skin that moves away from the wound, especially if it streaks away from the affected  area   Any other emergent concerns  Your vital signs today were: BP 131/88    Pulse 79    Temp 98.8 F (37.1 C) (Oral)    Resp 18    LMP 04/19/2015    SpO2 98%  If your blood pressure (BP) was elevated above 135/85 this visit, please have this repeated by your doctor within one month. --------------

## 2019-10-19 NOTE — ED Provider Notes (Signed)
[facility]-EMERGENCY DEPT Provider Note   CSN: [id number] Arrival date & time: 10/19/19  7209     History Chief Complaint  Patient presents with  . Extremity Laceration    right finger    Jennifer Stephens is a 49 y.o. female.  Right-handed patient presents emergency department for evaluation of crush injury to right index finger sustained approximately 1 hour prior to arrival.  Patient states that she closed the finger in a door.  Patient reports bleeding.  She applied a bandage.  No other treatments prior to arrival.  She tried to go to work but continued to have pain, prompting emergency department visit.  She states that her tetanus is up-to-date.  No other injuries reported.        Past Medical History:  Diagnosis Date  . Abnormal Pap smear 12/2011   Dr Tenny Craw  . Anemia    in context blood donor & vegetarian status  . Asthma   . Eosinophilia   . Palpitations 2002   sinus arrythmia on holter    Patient Active Problem List   Diagnosis Date Noted  . Palpitations 07/22/2016  . Elevated TSH 07/22/2016  . Abnormal mammogram 05/15/2016  . Elevated blood pressure reading 07/16/2015  . Anxiety 01/23/2014  . 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 Atypical squamous cells of undetermined significance on cytologic smear of cervix (ASC-US) 01/02/2012  . 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 Anemia 05/23/2008  . Mild intermittent asthma 03/15/2007    Past Surgical History:  Procedure Laterality Date  . CERVIX SURGERY  12/2011  . FLEXIBLE SIGMOIDOSCOPY  1999   Negative, Dr Virginia Rochester( done for rectal bleeding)  . Gravida 2 Para 2     Dr Tenny Craw  . WISDOM TOOTH EXTRACTION  1994     OB History   No obstetric history on file.     Family History  Problem Relation Age of Onset  . Prostate cancer Father   . Heart attack Father 48  . Emphysema Father   . Coronary artery disease Father        CABG four vessel  . Alcoholism Father   . Atrial  fibrillation Mother   . Diabetes Neg Hx   . Hyperlipidemia Neg Hx   . Stroke Neg Hx     Social History   Tobacco Use  . Smoking status: Former Smoker    Quit date: 01/21/1996    Years since quitting: 23.7  . Smokeless tobacco: Never Used  . Tobacco comment: age [age] , up to 1 ppd  Substance Use Topics  . Alcohol use: Yes    Comment: 1-2 per day - glasses of wine  . Drug use: No    Home Medications Prior to Admission medications   Medication Sig Start Date End Date Taking? Authorizing Provider  albuterol (PROAIR HFA) 108 (90 Base) MCG/ACT inhaler INHALE 1-2 PUFFS INTO THE LUNGS EVERY 4 HOURS AS NEEDED. 04/06/18   Burns, Bobette Mo, MD  LORazepam (ATIVAN) 0.5 MG tablet Take 1 tablet (0.5 mg total) by mouth every 8 (eight) hours as needed. for anxiety. Need office visit for more refills 07/29/18   Pincus Sanes, MD  SYMBICORT 80-4.5 MCG/ACT inhaler INHALE ONE TO TWO PUFFS INTO THE LUNGS EVERY 12 HOURS (SPIT AND GARGLE AFTER USE) 10/12/17   Pincus Sanes, MD    Allergies    Patient has no known allergies.  Review of Systems   Review of Systems  Constitutional: Negative for  activity change.  Musculoskeletal: Negative for arthralgias, back pain, joint swelling and neck pain.  Skin: Positive for wound.  Neurological: Positive for numbness. Negative for weakness.    Physical Exam Updated Vital Signs BP (!) 134/98   Pulse 91   Temp 98.8 F (37.1 C) (Oral)   Resp 18   SpO2 98%   Physical Exam Vitals and nursing note reviewed.  Constitutional:      Appearance: She is well-developed.  HENT:     Head: Normocephalic and atraumatic.  Eyes:     Pupils: Pupils are equal, round, and reactive to light.  Cardiovascular:     Pulses: Normal pulses. No decreased pulses.  Musculoskeletal:        General: Tenderness present.     Cervical back: Normal range of motion and neck supple.     Comments: Patient with a 15 mm laceration to the pad of the left index finger consistent with crush  injury.  Crepitus felt, consistent with underlying tuft fracture.  Nail and nailbed is intact without laceration.  Reports decreased sensation in fingertip.  Minimal active oozing.  Normal function of DIP, PIP, and MCP joints.  Skin:    General: Skin is warm and dry.  Neurological:     Mental Status: She is alert.     Sensory: No sensory deficit.     Comments: Motor, sensation, and vascular distal to the injury is fully intact.            ED Results / Procedures / Treatments   Labs (all labs ordered are listed, but only abnormal results are displayed) Labs Reviewed - No data to display  EKG None  Radiology DG Finger Index Right  Result Date: 10/19/2019 CLINICAL DATA:  Crush injury of right index finger. EXAM: RIGHT INDEX FINGER 2+V COMPARISON:  None. FINDINGS: Moderately displaced and comminuted fracture is seen involving the distal tuft of the second distal phalanx. No radiopaque foreign body is noted. Joint spaces are intact. IMPRESSION: Moderately displaced and comminuted second distal phalangeal fracture. Electronically Signed   By: Lupita Raider M.D.   On: 10/19/2019 10:29    Procedures .Marland KitchenLaceration Repair  Date/Time: 10/19/2019 11:20 AM Performed by: Renne Crigler, PA-C Authorized by: Renne Crigler, PA-C   Consent:    Consent obtained:  Verbal   Consent given by:  Patient   Risks discussed:  Infection, pain, retained foreign body, tendon damage, vascular damage, poor wound healing, poor cosmetic result and nerve damage   Alternatives discussed:  No treatment Anesthesia (see MAR for exact dosages):    Anesthesia method:  Nerve block   Block location:  R index   Block needle gauge:  25 G   Block anesthetic:  Lidocaine 2% w/o epi   Block technique:  3-sided ring block   Block injection procedure:  Anatomic landmarks identified, introduced needle, incremental injection, negative aspiration for blood and anatomic landmarks palpated   Block outcome:  Anesthesia  achieved Laceration details:    Location:  Finger   Finger location:  R index finger   Length (cm):  1.5 Repair type:    Repair type:  Intermediate   (including critical care time)  Medications Ordered in ED Medications  cephALEXin (KEFLEX) capsule 500 mg (500 mg Oral Given 10/19/19 1010)  lidocaine (XYLOCAINE) 2 % (with pres) injection 200 mg (200 mg Infiltration Given by Other 10/19/19 1042)    ED Course  I have reviewed the triage vital signs and the nursing notes.  Pertinent labs &  imaging results that were available during my care of the patient were reviewed by me and considered in my medical decision making (see chart for details).  Patient seen and examined.  Suspect open tuft fracture of right index finger.  X-rays pending.  Tetanus up-to-date.  We will proceed with digital block, wound exploration and irrigation.  Will likely discuss with orthopedic hand surgery.  Vital signs reviewed and are as follows: BP (!) 134/98   Pulse 91   Temp 98.8 F (37.1 C) (Oral)   Resp 18   SpO2 98%   11:17 AM Digital block performed. Wound irrigated with 1000cc NS under pressure. Wound explored after tourniquet applied. Non-viable tissue debrided with scissors.   11:49 AM Discussed plan with Dr. Lockie Mola.   I spoke with Sanjuana Mae PA-C, advises wound closure, follow-up with Dr. Melvyn Novas probably later in the week.   Pt updated. Wound closed as above without complication. Discussed with patient potential sensation changes/deficit with this type of injury.   Patient counseled on wound care. Patient counseled on need to call and follow-up with hand surgery. Patient was urged to return to the Emergency Department urgently with worsening pain, swelling, expanding erythema especially if it streaks away from the affected area, fever, or if they have any other concerns. Patient verbalized understanding.     MDM Rules/Calculators/A&P                          Patient with open finger  fracture, crush injury and associated skin avulsion.  Wound anesthetized and cleaned in the emergency department.  Discussed plan with orthopedic hand surgery.  Plan for follow-up.  Tetanus is up-to-date.  Antibiotics initiated given open fracture fingertip.   Final Clinical Impression(s) / ED Diagnoses Final diagnoses:  Open displaced fracture of distal phalanx of right index finger, initial encounter    Rx / DC Orders ED Discharge Orders         Ordered    HYDROcodone-acetaminophen (NORCO/VICODIN) 5-325 MG tablet  Every 6 hours PRN        10/19/19 1156    cephALEXin (KEFLEX) 500 MG capsule  4 times daily        10/19/19 1156           Renne Crigler, PA-C 10/19/19 1200    Virgina Norfolk, DO 10/19/19 1315

## 2019-10-19 NOTE — ED Triage Notes (Signed)
Pt reports right finger injury after slamming it in the door this morning. Patient has it wrapped up and bleeding controlled.    A/Ox4 Ambulatory in triage

## 2020-03-18 ENCOUNTER — Emergency Department (HOSPITAL_COMMUNITY): Payer: BLUE CROSS/BLUE SHIELD

## 2020-03-18 ENCOUNTER — Emergency Department (HOSPITAL_COMMUNITY)
Admission: EM | Admit: 2020-03-18 | Discharge: 2020-03-18 | Disposition: A | Discharge status: Home / Self Care | Payer: BLUE CROSS/BLUE SHIELD | Attending: Emergency Medicine | Admitting: Emergency Medicine

## 2020-03-18 ENCOUNTER — Encounter (HOSPITAL_COMMUNITY): Payer: Self-pay | Admitting: Emergency Medicine

## 2020-03-18 DIAGNOSIS — R002 Palpitations: Secondary | ICD-10-CM | POA: Diagnosis present

## 2020-03-18 DIAGNOSIS — Z87891 Personal history of nicotine dependence: Secondary | ICD-10-CM | POA: Diagnosis not present

## 2020-03-18 DIAGNOSIS — J4520 Mild intermittent asthma, uncomplicated: Secondary | ICD-10-CM | POA: Diagnosis not present

## 2020-03-18 DIAGNOSIS — I4719 Other supraventricular tachycardia: Secondary | ICD-10-CM

## 2020-03-18 DIAGNOSIS — R079 Chest pain, unspecified: Secondary | ICD-10-CM

## 2020-03-18 DIAGNOSIS — I471 Supraventricular tachycardia: Secondary | ICD-10-CM | POA: Diagnosis not present

## 2020-03-18 DIAGNOSIS — Z7952 Long term (current) use of systemic steroids: Secondary | ICD-10-CM | POA: Insufficient documentation

## 2020-03-18 DIAGNOSIS — M542 Cervicalgia: Secondary | ICD-10-CM | POA: Insufficient documentation

## 2020-03-18 LAB — BASIC METABOLIC PANEL
Anion gap: 16 — ABNORMAL HIGH (ref 5–15)
BUN: 15 mg/dL (ref 6–20)
CO2: 18 mmol/L — ABNORMAL LOW (ref 22–32)
Calcium: 9.4 mg/dL (ref 8.9–10.3)
Chloride: 103 mmol/L (ref 98–111)
Creatinine, Ser: 0.78 mg/dL (ref 0.44–1.00)
GFR, Estimated: >60 mL/min (ref >60)
Glucose, Bld: 120 mg/dL — ABNORMAL HIGH (ref 70–99)
Potassium: 4 mmol/L (ref 3.5–5.1)
Sodium: 137 mmol/L (ref 135–145)

## 2020-03-18 LAB — CBC
HCT: 45.3 % (ref 36.0–46.0)
Hemoglobin: 14.9 g/dL (ref 12.0–15.0)
MCH: 32 pg (ref 26.0–34.0)
MCHC: 32.9 g/dL (ref 30.0–36.0)
MCV: 97.2 fL (ref 80.0–100.0)
Platelets: 196 10*3/uL (ref 150–400)
RBC: 4.66 MIL/uL (ref 3.87–5.11)
RDW: 13.2 % (ref 11.5–15.5)
WBC: 8.2 10*3/uL (ref 4.0–10.5)
nRBC: 0 % (ref 0.0–0.2)

## 2020-03-18 LAB — TROPONIN I (HIGH SENSITIVITY): Troponin I (High Sensitivity): 3 ng/L (ref <18)

## 2020-03-18 MED ORDER — ADENOSINE 6 MG/2ML IV SOLN
INTRAVENOUS | Status: AC
  Administered 2020-03-18: 6 mg
  Filled 2020-03-18: qty 2

## 2020-03-18 MED ORDER — ADENOSINE 6 MG/2ML IV SOLN
INTRAVENOUS | Status: AC
  Administered 2020-03-18: 12 mg
  Filled 2020-03-18: qty 6

## 2020-03-18 MED ORDER — DILTIAZEM HCL 30 MG PO TABS: 30.0000 mg | ORAL_TABLET | Freq: Three times a day (TID) | ORAL | 0 refills | Status: AC

## 2020-03-18 NOTE — ED Triage Notes (Signed)
Patient here from home reporting palpitations that started ago. Shaky and sweaty. Reports hx of same in 2014.

## 2020-03-18 NOTE — ED Provider Notes (Signed)
[facility]-EMERGENCY DEPT Provider Note   CSN: [id number] Arrival date & time: 03/18/20  1350   Chief Complaint: Palpitations  History Chief Complaint  Patient presents with  . Tachycardia    Jennifer Stephens is a 50 y.o. female.  HPI   Jennifer Stephens is a 60 y.o. lady w/ PMHx AVNRT requiring adenosine in 2014, anxiety, mild intermittent asthma, and anemia, presenting with palpitations that began about 30 minutes PTA. She states she works for [employer] and was washing her hands when she suddenly noticed palpitations associated with chest pain described as a tightness that radiated up the sides of her neck bilaterally as well as diaphoresis. Her CP improved after she got to the ED. Denies fevers, chills, nausea, vomiting, numbness, weakness or any other symptoms. Had been given Carvedilol by her cardiologist that she she has not been taking as she believes it has expired and was told she no longer needed cardiology follow up about 4 years ago. She also takes Lorazepam 0.5mg  daily PRN for anxiety and took Symbicort this morning. Notes she drinks about 3 standard alcoholic beverages per day but denies any other drug use.   Past Medical History:  Diagnosis Date  . Abnormal Pap smear 12/2011   Dr Tenny Craw  . Anemia    in context blood donor & vegetarian status  . Asthma   . Eosinophilia   . Palpitations 2002   sinus arrythmia on holter    Patient Active Problem List   Diagnosis Date Noted  . Palpitations 07/22/2016  . Elevated TSH 07/22/2016  . Abnormal mammogram 05/15/2016  . Elevated blood pressure reading 07/16/2015  . Anxiety 01/23/2014  . 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 Atypical squamous cells of undetermined significance on cytologic smear of cervix (ASC-US) 01/02/2012  . 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 Anemia 05/23/2008  . Mild intermittent asthma 03/15/2007    Past Surgical History:  Procedure Laterality Date  . CERVIX SURGERY  12/2011   . FLEXIBLE SIGMOIDOSCOPY  1999   Negative, Dr Virginia Rochester( done for rectal bleeding)  . Gravida 2 Para 2     Dr Tenny Craw  . WISDOM TOOTH EXTRACTION  1994     OB History   No obstetric history on file.     Family History  Problem Relation Age of Onset  . Prostate cancer Father   . Heart attack Father 22  . Emphysema Father   . Coronary artery disease Father        CABG four vessel  . Alcoholism Father   . Atrial fibrillation Mother   . Diabetes Neg Hx   . Hyperlipidemia Neg Hx   . Stroke Neg Hx     Social History   Tobacco Use  . Smoking status: Former Smoker    Quit date: 01/21/1996    Years since quitting: 24.1  . Smokeless tobacco: Never Used  . Tobacco comment: age [age] , up to 1 ppd  Substance Use Topics  . Alcohol use: Yes    Comment: 1-2 per day - glasses of wine  . Drug use: No    Home Medications Prior to Admission medications   Medication Sig Start Date End Date Taking? Authorizing Provider  5-Hydroxytryptophan (5-HTP PO) Take 1 capsule by mouth daily.   Yes [provider]  albuterol (PROAIR HFA) 108 (90 Base) MCG/ACT inhaler INHALE 1-2 PUFFS INTO THE LUNGS EVERY 4 HOURS AS NEEDED. Patient taking differently: Inhale 1-2 puffs into the lungs every 4 (  four) hours as needed for wheezing or shortness of breath. 04/06/18  Yes Burns, Bobette Mo, MD  ASHWAGANDHA PO Take 1 capsule by mouth daily.   Yes [provider]  Black Cohosh 200 MG CAPS Take 200 mg by mouth daily.   Yes [provider]  diltiazem (CARDIZEM) 30 MG tablet Take 1 tablet (30 mg total) by mouth 3 (three) times daily. 03/18/20 03/18/21 Yes Glenford Bayley, MD  ibuprofen (ADVIL) 200 MG tablet Take 400 mg by mouth every 6 (six) hours as needed for mild pain.   Yes [provider]  LORazepam (ATIVAN) 0.5 MG tablet Take 1 tablet (0.5 mg total) by mouth every 8 (eight) hours as needed. for anxiety. Need office visit for more refills 07/29/18  Yes Burns, Bobette Mo, MD  SYMBICORT 80-4.5  MCG/ACT inhaler INHALE ONE TO TWO PUFFS INTO THE LUNGS EVERY 12 HOURS (SPIT AND GARGLE AFTER USE) Patient taking differently: Inhale 1-2 puffs into the lungs every 12 (twelve) hours. 10/12/17  Yes Burns, Bobette Mo, MD  vitamin C (ASCORBIC ACID) 500 MG tablet Take 500 mg by mouth daily.   Yes [provider]    Allergies    Patient has no known allergies.  Review of Systems   Review of Systems   10-point review of systems otherwise negative except as noted above in HPI.   Physical Exam Updated Vital Signs BP (!) 139/105   Pulse (!) 101   Temp (!) 97.5 F (36.4 C) (Oral)   Resp 18   LMP 04/19/2015   SpO2 98%   Physical Exam   General: Patient appears uncomfortable, in mild acute distress.  Eyes: Sclera non-icteric. No conjunctival injection.  HENT: Moist mucus membranes. No nasal discharge. Respiratory: Lungs are CTA, bilaterally. No wheezes, rales, or rhonchi. Patient is tachypneic although without accessory muscle use.  Cardiovascular: Rate is tachycardic in the low 200's. Rhythm is regular. No murmurs, rubs, or gallops. No lower extremity edema.  Abdominal: Soft and non-tender to palpation. No rebound or guarding. Neurological: Alert and oriented x 3. CN II-XII intact.  Skin:  Patient is diaphoretic. No lesions. No rashes.  Psych: Patient appears anxious. Normal tone of voice.   ED Results / Procedures / Treatments   Labs (all labs ordered are listed, but only abnormal results are displayed) Labs Reviewed  BASIC METABOLIC PANEL - Abnormal; Notable for the following components:      Result Value   CO2 18 (*)    Glucose, Bld 120 (*)    Anion gap 16 (*)    All other components within normal limits  CBC  I-STAT BETA HCG BLOOD, ED ([facility], [facility], [facility] ONLY)  TROPONIN I (HIGH SENSITIVITY)  TROPONIN I (HIGH SENSITIVITY)    EKG EKG Interpretation  Date/Time:  Sunday March 18 2020 13:59:13 EST Ventricular Rate:  223 PR Interval:    QRS Duration: 71 QT  Interval:  211 QTC Calculation: 407 R Axis:   65 Text Interpretation: Supraventricular tachycardia Probable anteroseptal infarct, recent Baseline wander in lead(s) V2 V3 V4 V5 12 Lead; Mason-Likar Abnormal ECG Confirmed by Gerhard Munch [id number]) on 03/18/2020 3:50:25 PM    Initial EKG:  SVT with rate of 223bpm.   Follow-up EKG: Sinus tachycardia at 111 bpm. Normal PR and QTc intervals without T wave inversions or ST segment changes to suggest ischemia.   Radiology DG Chest Port 1 View  Result Date: 03/18/2020 CLINICAL DATA:  Palpitations today with diaphoresis. History of asthma. EXAM: PORTABLE CHEST 1 VIEW  COMPARISON:  Radiographs 07/08/2019 and 11/08/2007. FINDINGS: 1435 hours. The heart size and mediastinal contours are stable. The lungs appear clear, although are partly obscured by overlying external pacer pads. There is no pleural effusion or pneumothorax. The bones appear unremarkable. Telemetry leads overlie the chest. IMPRESSION: No active cardiopulmonary process. Electronically Signed   By: Carey Bullocks M.D.   On: 03/18/2020 14:55    Procedures Procedures   Medications Ordered in ED Medications  adenosine (ADENOCARD) 6 MG/2ML injection (12 mg  Given 03/18/20 1430)  adenosine (ADENOCARD) 6 MG/2ML injection (6 mg  Given 03/18/20 1426)    ED Course  I have reviewed the triage vital signs and the nursing notes.  Pertinent labs & imaging results that were available during my care of the patient were reviewed by me and considered in my medical decision making (see chart for details).    MDM Rules/Calculators/A&P                          Jennifer Stephens is a 83 y.o. lady w/ PMHx AVNRT not on any AV nodal blocking agents at home, presenting in AVNRT with rates in the low 200's that did not respond to multiple vasovagal maneuvers prior to arrival. She last experienced this in 2014 where her AVNRT resolved with Adenosine.   Plan:   Patient given Atropine 6mg  without any significant  change.  Patient given Atropine 12mg . Rhythm converted to sinus tachycardia without underlying abnormality and no evidence of acute ischemia on EKG.   - Check CBC, BMP, troponin-I, beta-hcg  - Check portable CXR   Results:   Labs remarkable for anion gap of 16, bicarb of 18. Troponin and electrolytes within normal limits. CXR without acute cardiopulmonary disease.   Patient is stable for discharge home with cardiology follow-up. Will prescribe Cardizem 30mg  TID PRN for elevated heart rate and educate patient on vasovagal maneuvers and discuss importance of follow up with return precautions provided.   Final Clinical Impression(s) / ED Diagnoses Final diagnoses:  AVNRT (AV nodal re-entry tachycardia) (HCC)    Rx / DC Orders ED Discharge Orders         Ordered    diltiazem (CARDIZEM) 30 MG tablet  3 times daily        03/18/20 1559         , MD 03/18/2020, 4:27 PM Pager: 03/20/20    Glenford Bayley, MD 03/18/20 1627    [phone], MD 03/20/20 0025

## 2020-03-18 NOTE — Discharge Instructions (Addendum)
Ms. Benedict,  You were found to have a significantly elevated heart rate ("supraventricular tachycardia") in the hospital. Your heart rate returned to a regular rhythm after a second push of Adenosine.   Your other lab work and chest x-ray returned unremarkable.   I have prescribed you Cardizem. Please take 1 tablet up to three times daily as needed when you notice that your heart rate is significantly elevated (150bpm or greater). If your heart rate does not improve with this and with vasovagal maneuvers (cold compresses, bearing down for 15 seconds) or you develop new or worsening chest pain or other concerning symptoms, please return to the ED for evaluation.   Otherwise, please give Dr. Royann Shivers a call to schedule a follow up visit within the next 1-2 weeks.   Thank you and we hope you continue to feel better,  Dr. Laddie Aquas

## 2020-03-20 NOTE — ED Provider Notes (Signed)
.  Cardioversion  Date/Time: 03/19/2020 1:30 PM Performed by: Gerhard Munch, MD Authorized by: Gerhard Munch, MD   Consent:    Consent obtained:  Verbal   Consent given by:  Patient   Risks discussed:  Induced arrhythmia and pain   Alternatives discussed:  Anti-coagulation medication and rate-control medication Pre-procedure details:    Cardioversion basis:  Emergent   Rhythm:  Supraventricular tachycardia Patient sedated: No Attempt one:    Cardioversion mode attempt one: adenosine 6mg  - then 12mg . Post-procedure details:    Patient status:  Awake   Patient tolerance of procedure:  Tolerated well, no immediate complications      , MD 03/20/20 [phone]

## 2020-04-30 ENCOUNTER — Ambulatory Visit: Payer: BLUE CROSS/BLUE SHIELD | Admitting: Cardiovascular Disease

## 2020-04-30 ENCOUNTER — Other Ambulatory Visit: Payer: Self-pay

## 2020-06-23 ENCOUNTER — Other Ambulatory Visit (HOSPITAL_COMMUNITY): Payer: Self-pay

## 2020-06-23 MED ORDER — NITROFURANTOIN MONOHYD MACRO 100 MG PO CAPS
ORAL_CAPSULE | ORAL | 0 refills | Status: AC
  Filled 2020-06-23: qty 10, 5d supply, fill #0

## 2020-07-02 ENCOUNTER — Other Ambulatory Visit (HOSPITAL_COMMUNITY): Payer: Self-pay

## 2021-12-19 DIAGNOSIS — J302 Other seasonal allergic rhinitis: Secondary | ICD-10-CM | POA: Diagnosis not present

## 2021-12-19 DIAGNOSIS — E785 Hyperlipidemia, unspecified: Secondary | ICD-10-CM | POA: Diagnosis not present

## 2021-12-19 DIAGNOSIS — R69 Illness, unspecified: Secondary | ICD-10-CM | POA: Diagnosis not present

## 2021-12-19 DIAGNOSIS — Z8619 Personal history of other infectious and parasitic diseases: Secondary | ICD-10-CM | POA: Diagnosis not present

## 2021-12-19 DIAGNOSIS — Z1329 Encounter for screening for other suspected endocrine disorder: Secondary | ICD-10-CM | POA: Diagnosis not present

## 2021-12-19 DIAGNOSIS — J4520 Mild intermittent asthma, uncomplicated: Secondary | ICD-10-CM | POA: Diagnosis not present

## 2021-12-19 DIAGNOSIS — Z130 Encounter for screening for diseases of the blood and blood-forming organs and certain disorders involving the immune mechanism: Secondary | ICD-10-CM | POA: Diagnosis not present

## 2021-12-19 DIAGNOSIS — F419 Anxiety disorder, unspecified: Secondary | ICD-10-CM | POA: Diagnosis not present

## 2022-01-01 DIAGNOSIS — Z1231 Encounter for screening mammogram for malignant neoplasm of breast: Secondary | ICD-10-CM | POA: Diagnosis not present

## 2022-01-07 ENCOUNTER — Emergency Department (HOSPITAL_BASED_OUTPATIENT_CLINIC_OR_DEPARTMENT_OTHER): Payer: 59 | Admitting: Radiology

## 2022-01-07 ENCOUNTER — Emergency Department (HOSPITAL_BASED_OUTPATIENT_CLINIC_OR_DEPARTMENT_OTHER)
Admission: EM | Admit: 2022-01-07 | Discharge: 2022-01-07 | Disposition: A | Discharge status: Home / Self Care | Payer: 59 | Attending: Emergency Medicine | Admitting: Emergency Medicine

## 2022-01-07 ENCOUNTER — Other Ambulatory Visit: Payer: Self-pay

## 2022-01-07 ENCOUNTER — Encounter (HOSPITAL_BASED_OUTPATIENT_CLINIC_OR_DEPARTMENT_OTHER): Payer: Self-pay | Admitting: Emergency Medicine

## 2022-01-07 DIAGNOSIS — R030 Elevated blood-pressure reading, without diagnosis of hypertension: Secondary | ICD-10-CM | POA: Diagnosis not present

## 2022-01-07 DIAGNOSIS — Z7951 Long term (current) use of inhaled steroids: Secondary | ICD-10-CM | POA: Diagnosis not present

## 2022-01-07 DIAGNOSIS — Z8249 Family history of ischemic heart disease and other diseases of the circulatory system: Secondary | ICD-10-CM | POA: Diagnosis not present

## 2022-01-07 DIAGNOSIS — R0789 Other chest pain: Secondary | ICD-10-CM | POA: Insufficient documentation

## 2022-01-07 DIAGNOSIS — Z87891 Personal history of nicotine dependence: Secondary | ICD-10-CM | POA: Diagnosis not present

## 2022-01-07 DIAGNOSIS — F419 Anxiety disorder, unspecified: Secondary | ICD-10-CM | POA: Insufficient documentation

## 2022-01-07 DIAGNOSIS — E785 Hyperlipidemia, unspecified: Secondary | ICD-10-CM | POA: Diagnosis not present

## 2022-01-07 DIAGNOSIS — R0602 Shortness of breath: Secondary | ICD-10-CM | POA: Diagnosis not present

## 2022-01-07 DIAGNOSIS — R69 Illness, unspecified: Secondary | ICD-10-CM | POA: Diagnosis not present

## 2022-01-07 DIAGNOSIS — J45909 Unspecified asthma, uncomplicated: Secondary | ICD-10-CM | POA: Diagnosis not present

## 2022-01-07 LAB — CBC
HCT: 44.2 % (ref 36.0–46.0)
Hemoglobin: 14.6 g/dL (ref 12.0–15.0)
MCH: 31.5 pg (ref 26.0–34.0)
MCHC: 33 g/dL (ref 30.0–36.0)
MCV: 95.5 fL (ref 80.0–100.0)
Platelets: 233 10*3/uL (ref 150–400)
RBC: 4.63 MIL/uL (ref 3.87–5.11)
RDW: 12.8 % (ref 11.5–15.5)
WBC: 10.1 10*3/uL (ref 4.0–10.5)
nRBC: 0 % (ref 0.0–0.2)

## 2022-01-07 LAB — BASIC METABOLIC PANEL
Anion gap: 13 (ref 5–15)
BUN: 6 mg/dL (ref 6–20)
CO2: 21 mmol/L — ABNORMAL LOW (ref 22–32)
Calcium: 10 mg/dL (ref 8.9–10.3)
Chloride: 103 mmol/L (ref 98–111)
Creatinine, Ser: 0.65 mg/dL (ref 0.44–1.00)
GFR, Estimated: >60 mL/min (ref >60)
Glucose, Bld: 117 mg/dL — ABNORMAL HIGH (ref 70–99)
Potassium: 4.5 mmol/L (ref 3.5–5.1)
Sodium: 137 mmol/L (ref 135–145)

## 2022-01-07 LAB — TROPONIN I (HIGH SENSITIVITY)
Troponin I (High Sensitivity): <2 ng/L (ref <18)
Troponin I (High Sensitivity): <2 ng/L (ref <18)

## 2022-01-07 LAB — D-DIMER, QUANTITATIVE: D-Dimer, Quant: 0.37 ug/mL-FEU (ref 0.00–0.50)

## 2022-01-07 MED ORDER — MELOXICAM 7.5 MG PO TABS: 7.5000 mg | ORAL_TABLET | Freq: Every day | ORAL | 0 refills | Status: AC

## 2022-01-07 MED ORDER — FAMOTIDINE 20 MG PO TABS: 20.0000 mg | ORAL_TABLET | Freq: Two times a day (BID) | ORAL | 0 refills | Status: AC

## 2022-01-07 NOTE — ED Provider Notes (Signed)
[facility] EMERGENCY DEPT Provider Note   CSN: [id number] Arrival date & time: 01/07/22  1032     History  Chief Complaint  Patient presents with   Chest Pain    Jennifer Stephens is a 51 y.o. female.  Patient presents to the emergency meant for evaluation of chest tightness starting this morning around 2 AM.  She describes the tightness as being in the middle of the chest.  Later this morning it moved to the bilateral shoulders.  She feels it more when she takes a deep breath.  Is not really change with eating or drinking, movements.  She has been massaging the middle of her chest without improvement.  No lightheadedness or syncope.  She does have a history of anxiety.  She endorses many different stressors over the past several weeks.  Patient denies risk factors for pulmonary embolism including: unilateral leg swelling, history of DVT/PE/other blood clots, use of exogenous hormones, recent immobilizations, recent surgery, recent travel (>4hr segment), malignancy, hemoptysis.  She has a history of SVT, however usually controlled with maneuvers at home, current symptoms do not feel related to this.        Home Medications Prior to Admission medications   Medication Sig Start Date End Date Taking? Authorizing Provider  5-Hydroxytryptophan (5-HTP PO) Take 1 capsule by mouth daily.    [provider]  albuterol (PROAIR HFA) 108 (90 Base) MCG/ACT inhaler INHALE 1-2 PUFFS INTO THE LUNGS EVERY 4 HOURS AS NEEDED. Patient taking differently: Inhale 1-2 puffs into the lungs every 4 (four) hours as needed for wheezing or shortness of breath. 04/06/18   Pincus Sanes, MD  ASHWAGANDHA PO Take 1 capsule by mouth daily.    [provider]  Black Cohosh 200 MG CAPS Take 200 mg by mouth daily.    [provider]  diltiazem (CARDIZEM) 30 MG tablet Take 1 tablet (30 mg total) by mouth 3 (three) times daily. 03/18/20 03/18/21  Glenford Bayley, MD  ibuprofen  (ADVIL) 200 MG tablet Take 400 mg by mouth every 6 (six) hours as needed for mild pain.    [provider]  LORazepam (ATIVAN) 0.5 MG tablet Take 1 tablet (0.5 mg total) by mouth every 8 (eight) hours as needed. for anxiety. Need office visit for more refills 07/29/18   Pincus Sanes, MD  nitrofurantoin, macrocrystal-monohydrate, (MACROBID) 100 MG capsule Take 1 capsule by mouth every 12 hours for 5 days. 06/23/20     SYMBICORT 80-4.5 MCG/ACT inhaler INHALE ONE TO TWO PUFFS INTO THE LUNGS EVERY 12 HOURS (SPIT AND GARGLE AFTER USE) Patient taking differently: Inhale 1-2 puffs into the lungs every 12 (twelve) hours. 10/12/17   Pincus Sanes, MD  vitamin C (ASCORBIC ACID) 500 MG tablet Take 500 mg by mouth daily.    [provider]      Allergies    Patient has no known allergies.    Review of Systems   Review of Systems  Physical Exam Updated Vital Signs BP (!) 142/102   Pulse 87   Temp 97.6 F (36.4 C) (Oral)   Resp 17   Ht 5\' 8"  (1.727 m)   Wt 81.6 kg   LMP 04/19/2015   SpO2 99%   BMI 27.37 kg/m   Physical Exam Vitals and nursing note reviewed.  Constitutional:      Appearance: She is well-developed. She is not diaphoretic.  HENT:     Head: Normocephalic and atraumatic.     Mouth/Throat:  Mouth: Mucous membranes are not dry.  Eyes:     Conjunctiva/sclera: Conjunctivae normal.  Neck:     Vascular: Normal carotid pulses. No JVD.     Trachea: Trachea normal. No tracheal deviation.  Cardiovascular:     Rate and Rhythm: Normal rate and regular rhythm.     Pulses: No decreased pulses.          Radial pulses are 2+ on the right side and 2+ on the left side.     Heart sounds: Normal heart sounds, S1 normal and S2 normal. No murmur heard. Pulmonary:     Effort: Pulmonary effort is normal. No respiratory distress.     Breath sounds: No wheezing.  Chest:     Chest wall: No tenderness.  Abdominal:     General: Bowel sounds are normal.     Palpations: Abdomen  is soft.     Tenderness: There is no abdominal tenderness. There is no guarding or rebound.  Musculoskeletal:        General: Normal range of motion.     Cervical back: Normal range of motion and neck supple. No muscular tenderness.  Skin:    General: Skin is warm and dry.     Coloration: Skin is not pale.  Neurological:     Mental Status: She is alert.  Psychiatric:        Mood and Affect: Mood is anxious.     Comments: Patient becomes tearful when talking about several different stressors.     ED Results / Procedures / Treatments   Labs (all labs ordered are listed, but only abnormal results are displayed) Labs Reviewed  BASIC METABOLIC PANEL - Abnormal; Notable for the following components:      Result Value   CO2 21 (*)    Glucose, Bld 117 (*)    All other components within normal limits  CBC  D-DIMER, QUANTITATIVE  TROPONIN I (HIGH SENSITIVITY)  TROPONIN I (HIGH SENSITIVITY)    ED ECG REPORT   Date: 01/07/2022  Rate: 92  Rhythm: normal sinus rhythm  QRS Axis: normal  Intervals: normal  ST/T Wave abnormalities: normal  Conduction Disutrbances:none  Narrative Interpretation:   Old EKG Reviewed: changes noted, SVT resolved  I have personally reviewed the EKG tracing and agree with the computerized printout as noted.   Radiology DG Chest 2 View  Result Date: 01/07/2022 CLINICAL DATA:  Chest tightness and shortness of breath. EXAM: CHEST - 2 VIEW COMPARISON:  Chest x-ray dated March 18, 2020. FINDINGS: The heart size and mediastinal contours are within normal limits. Normal pulmonary vascularity. Minimal linear atelectasis/scarring in the lingula and right middle lobe. No focal consolidation, pleural effusion, or pneumothorax. No acute osseous abnormality. IMPRESSION: 1. No acute cardiopulmonary disease. Electronically Signed   By: Obie Dredge M.D.   On: 01/07/2022 11:01    Procedures Procedures    Medications Ordered in ED Medications - No data to  display  ED Course/ Medical Decision Making/ A&P    Patient seen and examined. History obtained directly from patient. Work-up including labs, imaging, EKG ordered in triage, if performed, were reviewed.    Labs/EKG: Independently reviewed and interpreted.  This included: CBC unremarkable; BMP unremarkable; first troponin less than 2.  Awaiting second troponin.  After discussion with patient, added D-dimer given pleuritic nature of her symptoms.  She is low risk Wells criteria.  Imaging: Independently visualized and interpreted.  This included: Chest x-ray, agree negative.  Medications/Fluids: None ordered  Most recent vital  signs reviewed and are as follows: BP (!) 142/102   Pulse 87   Temp 97.6 F (36.4 C) (Oral)   Resp 17   Ht 5\' 8"  (1.727 m)   Wt 81.6 kg   LMP 04/19/2015   SpO2 99%   BMI 27.37 kg/m   Initial impression: Atypical chest tightness.  3:43 PM Reassessment performed. Patient appears comfortable, sitting up in bed  Labs personally reviewed and interpreted including: D-dimer negative; second troponin negative.  Reviewed pertinent lab work and imaging with patient at bedside. Questions answered.  She says that she is comfortable with discharged home at this time.  Plan: Discharge to home.   Prescriptions written for: Meloxicam  Other home care instructions discussed: Rest  ED return instructions discussed: Return and follow-up instructions: I encouraged patient to return to ED with severe chest pain, especially if the pain is crushing or pressure-like and spreads to the arms, back, neck, or jaw, or if they have associated sweating, vomiting, or shortness of breath with the pain, or significant pain with activity. We discussed that the evaluation here today indicates a low-risk of serious cause of chest pain, including heart trouble or a blood clot, but no evaluation is perfect and chest pain can evolve with time. The patient verbalized understanding and agreed.  I  encouraged patient to follow-up with their provider in the next 48 hours for recheck.                             Medical Decision Making Amount and/or Complexity of Data Reviewed Labs: ordered. Radiology: ordered.   For this patient's complaint of chest pain, the following emergent conditions were considered on the differential diagnosis: acute coronary syndrome, pulmonary embolism, pneumothorax, myocarditis, pericardial tamponade, aortic dissection, thoracic aortic aneurysm complication, esophageal perforation.   Other causes were also considered including: gastroesophageal reflux disease, musculoskeletal pain including costochondritis, pneumonia/pleurisy, herpes zoster, pericarditis.  In regards to possibility of ACS, patient has atypical features of pain, non-ischemic and unchanged EKG and negative troponin(s). Heart score was calculated to be 3.   In regards to possibility of PE, symptoms are atypical for PE and risk profile is low, D-dimer negative today.  The patient's vital signs, pertinent lab work and imaging were reviewed and interpreted as discussed in the ED course. Hospitalization was considered for further testing, treatments, or serial exams/observation. However as patient is well-appearing, has a stable exam, and reassuring studies today, I do not feel that they warrant admission at this time. This plan was discussed with the patient who verbalizes agreement and comfort with this plan and seems reliable and able to return to the Emergency Department with worsening or changing symptoms.          Final Clinical Impression(s) / ED Diagnoses Final diagnoses:  Chest tightness    Rx / DC Orders ED Discharge Orders          Ordered    meloxicam (MOBIC) 7.5 MG tablet  Daily        01/07/22 1541              01/09/22, PA-C 01/07/22 1547    01/09/22, MD 01/10/22 1037

## 2022-01-07 NOTE — ED Notes (Signed)
Discharge paperwork given and verbally understood. 

## 2022-01-07 NOTE — Discharge Instructions (Signed)
Please read and follow all provided instructions.  Your diagnoses today include:  1. Chest tightness     Tests performed today include: An EKG of your heart A chest x-ray Cardiac enzymes - a blood test for heart muscle damage Blood counts and electrolytes D-dimer - screening test for blood clot in the lungs, was negative Vital signs. See below for your results today.   Medications prescribed:  Meloxicam - anti-inflammatory pain medication  You have been prescribed an anti-inflammatory medication or NSAID. Take with food. Do not take aspirin, ibuprofen, or naproxen if taking this medication. Take smallest effective dose for the shortest duration needed for your pain. Stop taking if you experience stomach pain or vomiting.   Take any prescribed medications only as directed.  Follow-up instructions: Please follow-up with your primary care provider as soon as you can for further evaluation of your symptoms.   Return instructions:  SEEK IMMEDIATE MEDICAL ATTENTION IF: You have severe chest pain, especially if the pain is crushing or pressure-like and spreads to the arms, back, neck, or jaw, or if you have sweating, nausea or vomiting, or trouble with breathing. THIS IS AN EMERGENCY. Do not wait to see if the pain will go away. Get medical help at once. Call 911. DO NOT drive yourself to the hospital.  Your chest pain gets worse and does not go away after a few minutes of rest.  You have an attack of chest pain lasting longer than what you usually experience.  You have significant dizziness, if you pass out, or have trouble walking.  You have chest pain not typical of your usual pain for which you originally saw your caregiver.  You have any other emergent concerns regarding your health.  Additional Information: Chest pain comes from many different causes. Your caregiver has diagnosed you as having chest pain that is not specific for one problem, but does not require admission.  You are  at low risk for an acute heart condition or other serious illness.   Your vital signs today were: BP (!) 142/102   Pulse 87   Temp 97.6 F (36.4 C) (Oral)   Resp 17   Ht 5\' 8"  (1.727 m)   Wt 81.6 kg   LMP 04/19/2015   SpO2 99%   BMI 27.37 kg/m  If your blood pressure (BP) was elevated above 135/85 this visit, please have this repeated by your doctor within one month. --------------

## 2022-01-07 NOTE — ED Triage Notes (Signed)
Pt arrives to ED with c/o chest tightness that started at 2pm last night. She notes SOB and pain that radiates to bilateral shoulders.

## 2022-01-17 DIAGNOSIS — Z9889 Other specified postprocedural states: Secondary | ICD-10-CM | POA: Diagnosis not present

## 2022-01-17 DIAGNOSIS — Z01419 Encounter for gynecological examination (general) (routine) without abnormal findings: Secondary | ICD-10-CM | POA: Diagnosis not present

## 2022-01-17 DIAGNOSIS — Z8619 Personal history of other infectious and parasitic diseases: Secondary | ICD-10-CM | POA: Diagnosis not present

## 2022-01-17 DIAGNOSIS — R69 Illness, unspecified: Secondary | ICD-10-CM | POA: Diagnosis not present

## 2022-01-17 DIAGNOSIS — Z124 Encounter for screening for malignant neoplasm of cervix: Secondary | ICD-10-CM | POA: Diagnosis not present

## 2022-02-12 ENCOUNTER — Ambulatory Visit: Payer: 59 | Admitting: Internal Medicine

## 2022-05-28 DIAGNOSIS — E785 Hyperlipidemia, unspecified: Secondary | ICD-10-CM | POA: Diagnosis not present

## 2022-05-28 DIAGNOSIS — Z7951 Long term (current) use of inhaled steroids: Secondary | ICD-10-CM | POA: Diagnosis not present

## 2022-05-28 DIAGNOSIS — Z85828 Personal history of other malignant neoplasm of skin: Secondary | ICD-10-CM | POA: Diagnosis not present

## 2022-05-28 DIAGNOSIS — Z87891 Personal history of nicotine dependence: Secondary | ICD-10-CM | POA: Diagnosis not present

## 2022-05-28 DIAGNOSIS — Z8249 Family history of ischemic heart disease and other diseases of the circulatory system: Secondary | ICD-10-CM | POA: Diagnosis not present

## 2022-05-28 DIAGNOSIS — M1990 Unspecified osteoarthritis, unspecified site: Secondary | ICD-10-CM | POA: Diagnosis not present

## 2022-05-28 DIAGNOSIS — Z825 Family history of asthma and other chronic lower respiratory diseases: Secondary | ICD-10-CM | POA: Diagnosis not present

## 2022-05-28 DIAGNOSIS — I499 Cardiac arrhythmia, unspecified: Secondary | ICD-10-CM | POA: Diagnosis not present

## 2022-05-28 DIAGNOSIS — J45909 Unspecified asthma, uncomplicated: Secondary | ICD-10-CM | POA: Diagnosis not present

## 2022-05-28 DIAGNOSIS — F419 Anxiety disorder, unspecified: Secondary | ICD-10-CM | POA: Diagnosis not present

## 2022-05-28 DIAGNOSIS — I10 Essential (primary) hypertension: Secondary | ICD-10-CM | POA: Diagnosis not present

## 2022-05-28 DIAGNOSIS — R32 Unspecified urinary incontinence: Secondary | ICD-10-CM | POA: Diagnosis not present

## 2022-06-04 DIAGNOSIS — F419 Anxiety disorder, unspecified: Secondary | ICD-10-CM | POA: Diagnosis not present

## 2022-06-04 DIAGNOSIS — E7849 Other hyperlipidemia: Secondary | ICD-10-CM | POA: Diagnosis not present

## 2022-06-04 DIAGNOSIS — Z8619 Personal history of other infectious and parasitic diseases: Secondary | ICD-10-CM | POA: Diagnosis not present

## 2022-06-04 DIAGNOSIS — J4520 Mild intermittent asthma, uncomplicated: Secondary | ICD-10-CM | POA: Diagnosis not present

## 2022-06-19 IMAGING — CR DG FINGER INDEX 2+V*R*
3 series · 3 of 3 positions shown · non-contrast
Comparison: None.

CLINICAL DATA: Crush injury of right index finger.

EXAM:
RIGHT INDEX FINGER 2+V

[x finger pa right]
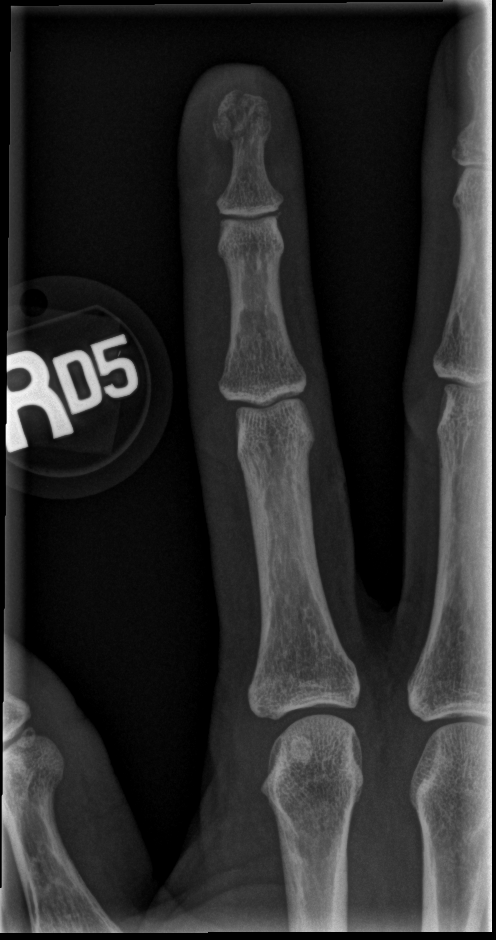

[x finger obl right]
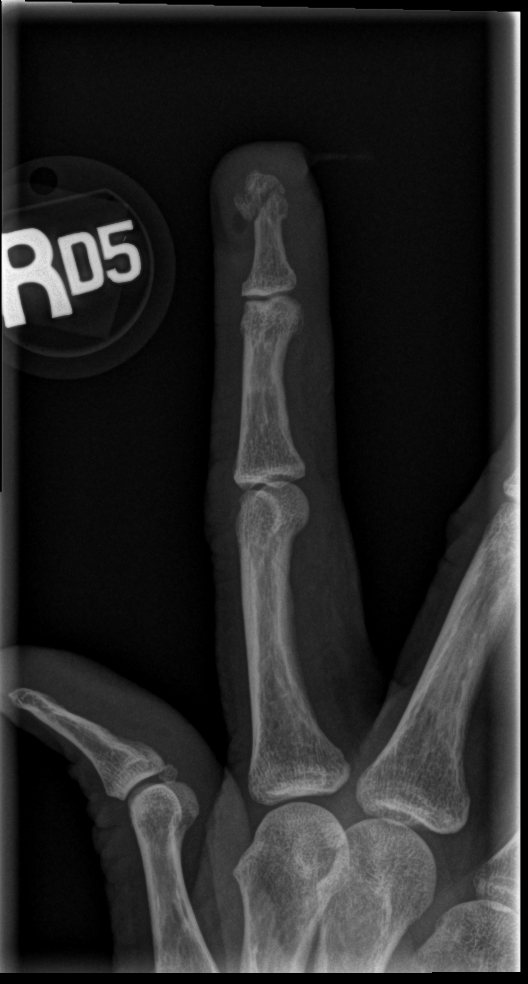

[x finger lat right]
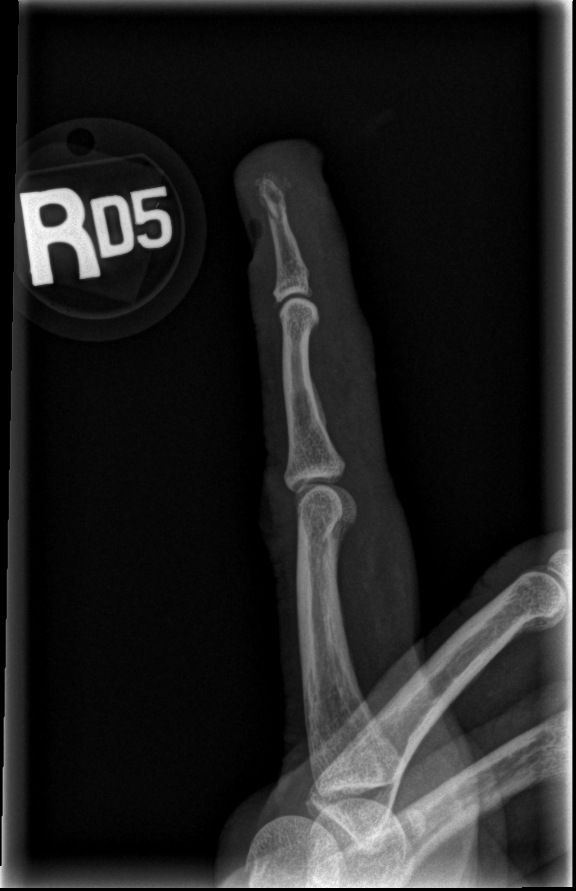

[3 of 3 positions shown; findings below may reference images not displayed]

FINDINGS: Moderately displaced and comminuted fracture is seen involving the
distal tuft of the second distal phalanx. No radiopaque foreign body
is noted. Joint spaces are intact.
IMPRESSION: Moderately displaced and comminuted second distal phalangeal
fracture.

## 2022-12-15 DIAGNOSIS — R5383 Other fatigue: Secondary | ICD-10-CM | POA: Diagnosis not present

## 2022-12-15 DIAGNOSIS — R519 Headache, unspecified: Secondary | ICD-10-CM | POA: Diagnosis not present

## 2022-12-15 DIAGNOSIS — R509 Fever, unspecified: Secondary | ICD-10-CM | POA: Diagnosis not present

## 2023-01-12 DIAGNOSIS — Z1329 Encounter for screening for other suspected endocrine disorder: Secondary | ICD-10-CM | POA: Diagnosis not present

## 2023-01-12 DIAGNOSIS — Z0000 Encounter for general adult medical examination without abnormal findings: Secondary | ICD-10-CM | POA: Diagnosis not present

## 2023-01-12 DIAGNOSIS — E785 Hyperlipidemia, unspecified: Secondary | ICD-10-CM | POA: Diagnosis not present

## 2023-01-12 DIAGNOSIS — F419 Anxiety disorder, unspecified: Secondary | ICD-10-CM | POA: Diagnosis not present

## 2023-01-12 DIAGNOSIS — Z7189 Other specified counseling: Secondary | ICD-10-CM | POA: Diagnosis not present

## 2023-01-12 DIAGNOSIS — E7889 Other lipoprotein metabolism disorders: Secondary | ICD-10-CM | POA: Diagnosis not present

## 2023-01-12 DIAGNOSIS — D649 Anemia, unspecified: Secondary | ICD-10-CM | POA: Diagnosis not present

## 2023-01-23 DIAGNOSIS — Z1231 Encounter for screening mammogram for malignant neoplasm of breast: Secondary | ICD-10-CM | POA: Diagnosis not present

## 2023-02-17 DIAGNOSIS — J4520 Mild intermittent asthma, uncomplicated: Secondary | ICD-10-CM | POA: Diagnosis not present

## 2023-02-17 DIAGNOSIS — F419 Anxiety disorder, unspecified: Secondary | ICD-10-CM | POA: Diagnosis not present

## 2023-02-17 DIAGNOSIS — E782 Mixed hyperlipidemia: Secondary | ICD-10-CM | POA: Diagnosis not present
